# Patient Record
Sex: Male | Born: 1965 | Race: Black or African American | Hispanic: No | Marital: Married | Smoking: Never smoker
Health system: Southern US, Community
[De-identification: ages and names within clinical notes are randomized; demographics above are authoritative.]

## PROBLEM LIST (undated history)

## (undated) DIAGNOSIS — I471 Supraventricular tachycardia: Secondary | ICD-10-CM

## (undated) DIAGNOSIS — F419 Anxiety disorder, unspecified: Secondary | ICD-10-CM

## (undated) DIAGNOSIS — I4891 Unspecified atrial fibrillation: Secondary | ICD-10-CM

## (undated) HISTORY — DX: Anxiety disorder, unspecified: F41.9

## (undated) HISTORY — DX: Unspecified atrial fibrillation: I48.91

## (undated) HISTORY — DX: Supraventricular tachycardia: I47.1

---

## 2007-04-27 ENCOUNTER — Encounter: Payer: Self-pay | Admitting: Family Medicine

## 2007-05-03 ENCOUNTER — Ambulatory Visit: Payer: Self-pay | Admitting: Family Medicine

## 2007-05-03 DIAGNOSIS — R002 Palpitations: Secondary | ICD-10-CM | POA: Insufficient documentation

## 2007-05-03 DIAGNOSIS — I10 Essential (primary) hypertension: Secondary | ICD-10-CM | POA: Insufficient documentation

## 2007-05-07 ENCOUNTER — Encounter: Payer: Self-pay | Admitting: Family Medicine

## 2007-05-07 DIAGNOSIS — E785 Hyperlipidemia, unspecified: Secondary | ICD-10-CM | POA: Insufficient documentation

## 2007-05-07 LAB — CONVERTED CEMR LAB
ALT: 18 units/L (ref 0–53)
AST: 15 units/L (ref 0–37)
Alkaline Phosphatase: 70 units/L (ref 39–117)
BUN: 10 mg/dL (ref 6–23)
Creatinine, Ser: 1.07 mg/dL (ref 0.40–1.50)
Glucose, Bld: 92 mg/dL (ref 70–99)
Sodium: 138 meq/L (ref 135–145)
Total Bilirubin: 1.5 mg/dL — ABNORMAL HIGH (ref 0.3–1.2)
Total CHOL/HDL Ratio: 6
Total Protein: 7.9 g/dL (ref 6.0–8.3)
Triglycerides: 125 mg/dL (ref <150)

## 2007-05-14 ENCOUNTER — Ambulatory Visit: Payer: Self-pay | Admitting: Family Medicine

## 2007-05-14 DIAGNOSIS — F411 Generalized anxiety disorder: Secondary | ICD-10-CM | POA: Insufficient documentation

## 2007-05-15 ENCOUNTER — Encounter: Payer: Self-pay | Admitting: Family Medicine

## 2007-05-21 ENCOUNTER — Telehealth: Payer: Self-pay | Admitting: Family Medicine

## 2007-05-25 ENCOUNTER — Ambulatory Visit: Payer: Self-pay | Admitting: Family Medicine

## 2007-05-29 ENCOUNTER — Ambulatory Visit: Payer: Self-pay | Admitting: Family Medicine

## 2007-06-04 ENCOUNTER — Telehealth: Payer: Self-pay | Admitting: Family Medicine

## 2007-06-05 ENCOUNTER — Encounter: Payer: Self-pay | Admitting: Family Medicine

## 2007-06-13 ENCOUNTER — Telehealth: Payer: Self-pay | Admitting: Family Medicine

## 2007-06-20 ENCOUNTER — Telehealth: Payer: Self-pay | Admitting: Family Medicine

## 2007-06-22 ENCOUNTER — Ambulatory Visit: Payer: Self-pay | Admitting: Family Medicine

## 2007-07-09 ENCOUNTER — Ambulatory Visit: Payer: Self-pay | Admitting: Family Medicine

## 2007-07-16 ENCOUNTER — Ambulatory Visit: Payer: Self-pay | Admitting: Family Medicine

## 2007-07-16 DIAGNOSIS — R519 Headache, unspecified: Secondary | ICD-10-CM | POA: Insufficient documentation

## 2007-07-16 DIAGNOSIS — R51 Headache: Secondary | ICD-10-CM | POA: Insufficient documentation

## 2007-07-20 ENCOUNTER — Ambulatory Visit (HOSPITAL_COMMUNITY): Payer: Self-pay | Admitting: Psychiatry

## 2007-07-30 ENCOUNTER — Ambulatory Visit (HOSPITAL_COMMUNITY): Payer: Self-pay | Admitting: Psychiatry

## 2007-10-05 ENCOUNTER — Ambulatory Visit: Payer: Self-pay | Admitting: Family Medicine

## 2007-10-05 LAB — CONVERTED CEMR LAB
HDL: 52 mg/dL (ref >39)
LDL Cholesterol: 195 mg/dL — ABNORMAL HIGH (ref 0–99)
Total CHOL/HDL Ratio: 5.2

## 2007-10-08 ENCOUNTER — Encounter: Payer: Self-pay | Admitting: Family Medicine

## 2007-10-08 ENCOUNTER — Telehealth (INDEPENDENT_AMBULATORY_CARE_PROVIDER_SITE_OTHER): Payer: Self-pay | Admitting: *Deleted

## 2007-10-08 LAB — CONVERTED CEMR LAB: ALT: 21 units/L (ref 0–53)

## 2007-11-02 ENCOUNTER — Ambulatory Visit: Payer: Self-pay | Admitting: Family Medicine

## 2007-11-02 DIAGNOSIS — F411 Generalized anxiety disorder: Secondary | ICD-10-CM | POA: Insufficient documentation

## 2007-12-04 ENCOUNTER — Ambulatory Visit: Payer: Self-pay | Admitting: Family Medicine

## 2008-02-11 ENCOUNTER — Ambulatory Visit: Payer: Self-pay | Admitting: Family Medicine

## 2008-02-14 ENCOUNTER — Encounter: Payer: Self-pay | Admitting: Family Medicine

## 2008-02-15 LAB — CONVERTED CEMR LAB
ALT: 45 units/L (ref 0–53)
Albumin: 4.9 g/dL (ref 3.5–5.2)
CO2: 26 meq/L (ref 19–32)
Calcium: 9.9 mg/dL (ref 8.4–10.5)
Creatinine, Ser: 1.05 mg/dL (ref 0.40–1.50)
Glucose, Bld: 86 mg/dL (ref 70–99)
Sodium: 140 meq/L (ref 135–145)
Total CHOL/HDL Ratio: 2.8
VLDL: 12 mg/dL (ref 0–40)

## 2008-06-18 ENCOUNTER — Ambulatory Visit: Payer: Self-pay | Admitting: Family Medicine

## 2008-06-18 DIAGNOSIS — K2990 Gastroduodenitis, unspecified, without bleeding: Secondary | ICD-10-CM

## 2008-06-18 DIAGNOSIS — K219 Gastro-esophageal reflux disease without esophagitis: Secondary | ICD-10-CM | POA: Insufficient documentation

## 2008-06-18 DIAGNOSIS — K2970 Gastritis, unspecified, without bleeding: Secondary | ICD-10-CM | POA: Insufficient documentation

## 2008-09-04 ENCOUNTER — Ambulatory Visit: Payer: Self-pay | Admitting: Family Medicine

## 2008-09-10 ENCOUNTER — Telehealth: Payer: Self-pay | Admitting: Family Medicine

## 2008-09-15 ENCOUNTER — Ambulatory Visit: Payer: Self-pay | Admitting: Family Medicine

## 2008-10-29 ENCOUNTER — Ambulatory Visit: Payer: Self-pay | Admitting: Family Medicine

## 2008-10-29 DIAGNOSIS — B009 Herpesviral infection, unspecified: Secondary | ICD-10-CM | POA: Insufficient documentation

## 2008-12-22 ENCOUNTER — Ambulatory Visit: Payer: Self-pay | Admitting: Family Medicine

## 2009-02-24 ENCOUNTER — Ambulatory Visit: Payer: Self-pay | Admitting: Family Medicine

## 2009-02-24 DIAGNOSIS — L301 Dyshidrosis [pompholyx]: Secondary | ICD-10-CM | POA: Insufficient documentation

## 2009-02-25 LAB — CONVERTED CEMR LAB
ALT: 30 units/L (ref 0–53)
AST: 20 units/L (ref 0–37)
Albumin: 4.7 g/dL (ref 3.5–5.2)
Alkaline Phosphatase: 65 units/L (ref 39–117)
BUN: 13 mg/dL (ref 6–23)
Calcium: 10.3 mg/dL (ref 8.4–10.5)
Glucose, Bld: 95 mg/dL (ref 70–99)
Potassium: 4.6 meq/L (ref 3.5–5.3)
Sodium: 138 meq/L (ref 135–145)
Total CHOL/HDL Ratio: 5
Total Protein: 7.7 g/dL (ref 6.0–8.3)
Triglycerides: 90 mg/dL (ref <150)
VLDL: 18 mg/dL (ref 0–40)

## 2009-03-04 ENCOUNTER — Telehealth (INDEPENDENT_AMBULATORY_CARE_PROVIDER_SITE_OTHER): Payer: Self-pay | Admitting: *Deleted

## 2009-05-22 ENCOUNTER — Telehealth: Payer: Self-pay | Admitting: Family Medicine

## 2009-06-23 ENCOUNTER — Telehealth: Payer: Self-pay | Admitting: Family Medicine

## 2009-07-30 ENCOUNTER — Encounter: Payer: Self-pay | Admitting: Family Medicine

## 2009-09-01 ENCOUNTER — Ambulatory Visit: Payer: Self-pay | Admitting: Family Medicine

## 2009-12-28 ENCOUNTER — Telehealth: Payer: Self-pay | Admitting: Family Medicine

## 2010-02-03 ENCOUNTER — Emergency Department (HOSPITAL_COMMUNITY): Admission: EM | Admit: 2010-02-03 | Discharge: 2010-02-03 | Payer: Self-pay | Admitting: Emergency Medicine

## 2010-02-04 ENCOUNTER — Ambulatory Visit: Payer: Self-pay | Admitting: Family Medicine

## 2010-02-04 DIAGNOSIS — I471 Supraventricular tachycardia, unspecified: Secondary | ICD-10-CM

## 2010-02-04 HISTORY — DX: Supraventricular tachycardia: I47.1

## 2010-02-04 HISTORY — DX: Supraventricular tachycardia, unspecified: I47.10

## 2010-02-05 LAB — CONVERTED CEMR LAB
ALT: 43 units/L (ref 0–53)
Albumin: 4.6 g/dL (ref 3.5–5.2)
Alkaline Phosphatase: 63 units/L (ref 39–117)
Cholesterol: 154 mg/dL (ref 0–200)
LDL Cholesterol: 95 mg/dL (ref 0–99)
TSH: 0.801 microintl units/mL (ref 0.350–4.500)
Total CHOL/HDL Ratio: 3.3
Triglycerides: 67 mg/dL (ref <150)

## 2010-03-01 ENCOUNTER — Encounter: Payer: Self-pay | Admitting: Family Medicine

## 2010-06-03 ENCOUNTER — Ambulatory Visit: Payer: Self-pay | Admitting: Family Medicine

## 2010-08-31 ENCOUNTER — Ambulatory Visit: Payer: Self-pay | Admitting: Family Medicine

## 2010-09-01 LAB — CONVERTED CEMR LAB
ALT: 31 units/L (ref 0–53)
AST: 20 units/L (ref 0–37)
Albumin: 5.3 g/dL — ABNORMAL HIGH (ref 3.5–5.2)
Alkaline Phosphatase: 67 units/L (ref 39–117)
Calcium: 10 mg/dL (ref 8.4–10.5)
Creatinine, Ser: 0.98 mg/dL (ref 0.40–1.50)
HCT: 43.2 % (ref 39.0–52.0)
MCV: 93.1 fL (ref 78.0–100.0)
Potassium: 4.5 meq/L (ref 3.5–5.3)
RBC: 4.64 M/uL (ref 4.22–5.81)
Sodium: 141 meq/L (ref 135–145)
Total Bilirubin: 1 mg/dL (ref 0.3–1.2)

## 2010-09-09 ENCOUNTER — Telehealth: Payer: Self-pay | Admitting: Family Medicine

## 2010-09-17 ENCOUNTER — Encounter: Payer: Self-pay | Admitting: Family Medicine

## 2010-12-01 NOTE — Letter (Signed)
Summary: Work Excuse  [facility] [place]  [address] [place] [address], [address]   [place], [place] [address]   Phone: [phone]  Fax: [phone]    Today's Date: June 03, 2010  Name of Patient: Russell Spencer  The above named patient had a medical visit today at:  am / pm.  Please take this into consideration when reviewing the time away from work/school.    Special Instructions:  [  ] None  [  ] To be off the remainder of today, returning to the normal work / school schedule tomorrow.  [  ] To be off until the next scheduled appointment on ______________________.  [  ] Other ________________________________________________________________ ________________________________________________________________________   Sincerely yours,   Nani Gasser MD

## 2010-12-01 NOTE — Consult Note (Signed)
Summary: [facility]  [facility]   Imported By: Lanelle Bal 03/08/2010 08:32:53  _____________________________________________________________________  External Attachment:    Type:   Image     Comment:   External Document

## 2010-12-01 NOTE — Consult Note (Signed)
Summary: [facility]   Imported By: Lanelle Bal 10/06/2010 15:16:26  _____________________________________________________________________  External Attachment:    Type:   Image     Comment:   External Document

## 2010-12-01 NOTE — Assessment & Plan Note (Signed)
Summary: GERD, Upper chest discomfort.   Vital Signs:  Patient profile:   45 year old male Height:      71 inches Weight:      159 pounds Temp:     98.9 degrees F oral Pulse rate:   67 / minute BP sitting:   139 / 88  (right arm) Cuff size:   regular  Vitals Entered By: Avon Gully CMA, Duncan Dull) (August 31, 2010 8:57 AM) CC: acid reflux? feels burning sensation in the chest tried Prilosec and that didnt help, fasting blood work?   Primary Care Provider:  Nani Gasser MD  CC:  acid reflux? feels burning sensation in the chest tried Prilosec and that didnt help and fasting blood work?Marland Kitchen  History of Present Illness: acid reflux? feels burning sensation in the upper chest tried Prilosec and that didnt help, fasting blood work?  Felt the prilosec didn't help, even after taking for 2 months. Worse in the AM. No problems with dysphagia. Occ will belch and it will feel so much better.  Not worse with eating food.   No brash.  No abdominal pain. Discomfort in the upper chest almost every days, mostly in the morning. Doesn't usually eat breakfast.  Then feels it disturbs his breathing and then feels anxious but not every time.      Current Medications (verified): 1)  Lisinopril-Hydrochlorothiazide 10-12.5 Mg  Tabs (Lisinopril-Hydrochlorothiazide) .... Take 1 Tablet By Mouth Once A Day 2)  Lipitor 40 Mg  Tabs (Atorvastatin Calcium) .Marland Kitchen.. 1 Tab By Mouth Qhs 3)  Alprazolam 0.25 Mg Tabs (Alprazolam) .... Take 1 Tablet By Mouth Three Times A Day As Needed For Anxiety  Allergies (verified): 1)  ! Pcn  Comments:  Nurse/Medical Assistant: The patient's medications and allergies were reviewed with the patient and were updated in the Medication and Allergy Lists. Avon Gully CMA, Duncan Dull) (August 31, 2010 8:59 AM)  Physical Exam  General:  Well-developed,well-nourished,in no acute distress; alert,appropriate and cooperative throughout examination Mouth:  Oral mucosa and  oropharynx without lesions or exudates.  Teeth in good repair. Lungs:  Normal respiratory effort, chest expands symmetrically. Lungs are clear to auscultation, no crackles or wheezes. Heart:  Normal rate and regular rhythm. S1 and S2 normal without gallop, murmur, click, rub or other extra sounds. Abdomen:  Bowel sounds positive,abdomen soft and non-tender without masses, organomegaly or hernias noted.   Impression & Recommendations:  Problem # 1:  GERD (ICD-530.81) Will change to dexilant since has failed prilosec. If not better in one week then will refer to GI for further evaluation.  Also consider this could be related ot his anxiety but want to rule out any GI problems first. This does not sounds cardiac at all. Will get labs to rule out anemia, thyroid problems or electrolyte disturbance.    Complete Medication List: 1)  Lisinopril-hydrochlorothiazide 10-12.5 Mg Tabs (Lisinopril-hydrochlorothiazide) .... Take 1 tablet by mouth once a day 2)  Lipitor 40 Mg Tabs (Atorvastatin calcium) .Marland Kitchen.. 1 tab by mouth qhs 3)  Alprazolam 0.25 Mg Tabs (Alprazolam) .... Take 1 tablet by mouth three times a day as needed for anxiety  Other Orders: T-CBC No Diff ([id number]) T-Comprehensive Metabolic Panel ([id number]) T-TSH ([id number])  Patient Instructions: 1)  If not better in one week on the samples then call our office and willrefer to Gastroenterology.    Orders Added: 1)  T-CBC No Diff [[id number]] 2)  T-Comprehensive Metabolic Panel [[id number]] 3)  T-TSH [[id number]] 4)  Est. Patient Level IV [  [address]] 

## 2010-12-01 NOTE — Progress Notes (Signed)
Summary: Med for getting anxious  Phone Note Call from Patient Call back at Home Phone [phone]   Summary of Call: Has to give a presentation at work and gets very anxious and you told him there was something you could give him that would help. [facility]ses [facility]. Initial call taken by: Kathlene November,  December 28, 2009 11:41 AM  Follow-up for Phone Call        Can start sertraline and then f/u in 2-3 weeks.  Follow-up by: Nani Gasser MD,  December 28, 2009 12:06 PM  Additional Follow-up for Phone Call Additional follow up Details #1::        Pt notified Additional Follow-up by: Kathlene November,  December 28, 2009 12:27 PM    New/Updated Medications: SERTRALINE HCL 50 MG TABS (SERTRALINE HCL) one by mouth daily Prescriptions: SERTRALINE HCL 50 MG TABS (SERTRALINE HCL) one by mouth daily  #30 x 0   Entered and Authorized by:   Nani Gasser MD   Signed by:   Nani Gasser MD on 12/28/2009   Method used:   [facility] to        [phone][facility] (retail)       [address] [place], [place]  [address]       Ph: [phone] or [phone]       Fax: [phone]   RxID:   [id number]

## 2010-12-01 NOTE — Progress Notes (Signed)
Summary: referral  Phone Note Call from Patient Call back at Home Phone [phone] Call back at [phone]   Caller: Patient Call For: Nani Gasser MD Summary of Call: Pt would like the GI referral that you had discussed with him at last office visit Initial call taken by: Kathlene November LPN,  September 09, 2010 2:43 PM  Follow-up for Phone Call        + done Follow-up by: Nani Gasser MD,  September 09, 2010 3:11 PM

## 2010-12-01 NOTE — Assessment & Plan Note (Signed)
Summary: super ventricular Tachacardia  (SVT)    Vital Signs:  Patient profile:   45 year old male Height:      71 inches Weight:      159 pounds BMI:     22.26 Pulse rate:   77 / minute BP sitting:   130 / 76  (left arm) Cuff size:   large  Vitals Entered By: Kathlene November (February 04, 2010 11:03 AM)  CC: followup from ED yesterday- SVT heart rate was 236- told to followup and discuss a beta blocker   Primary Care Provider:  Nani Gasser MD  CC:  followup from ED yesterday- SVT heart rate was 236- told to followup and discuss a beta blocker.  History of Present Illness: followup from ED yesterday- SVT heart rate was 236- told to followup and discuss a beta blocker.  Pt had a similar episode in 2008 and was seen by Dr. Lendon Colonel at [facility] and had a normal stress test at that time per our records. Also normal echo.  This episode started about 1 hour after eating a fish sandwhich. No caffein. Nothing unusual. Started while pumping gas. Last episode was in 2008.  Today he feels fine. He was given IV Adenosine and the tachycardia resolved. He has not history of drug use.       Current Medications (verified): 1)  Lisinopril-Hydrochlorothiazide 10-12.5 Mg  Tabs (Lisinopril-Hydrochlorothiazide) .... Take 1 Tablet By Mouth Once A Day 2)  Lipitor 40 Mg  Tabs (Atorvastatin Calcium) .Marland Kitchen.. 1 Tab By Mouth Qhs 3)  Triamcinolone in Absorbase 0.05 % Oint (Triamcinolone Acetonide) .... Apply Once Daily To Affected Area.  Allergies (verified): 1)  ! Pcn  Comments:  Nurse/Medical Assistant: The patient's medications and allergies were reviewed with the patient and were updated in the Medication and Allergy Lists. Kathlene November (February 04, 2010 11:04 AM)  Social History: Reviewed history from 12/22/2008 and no changes required. Working for [employer] as a Production designer, theatre/television/film. Married to [employer] with 3 children.   Never Smoked Alcohol use-yes Drug use-no Regular exercise-no  Physical Exam  General:   Well-developed,well-nourished,in no acute distress; alert,appropriate and cooperative throughout examination Head:  Normocephalic and atraumatic without obvious abnormalities. No apparent alopecia or balding. Lungs:  Normal respiratory effort, chest expands symmetrically. Lungs are clear to auscultation, no crackles or wheezes. Heart:  Normal rate and regular rhythm. S1 and S2 normal without gallop, murmur, click, rub or other extra sounds. Skin:  no rashes.   Cervical Nodes:  No lymphadenopathy noted Psych:  Cognition and judgment appear intact. Alert and cooperative with normal attention span and concentration. No apparent delusions, illusions, hallucinations   Impression & Recommendations:  Problem # 1:  SUPRAVENTRICULAR TACHYCARDIA (ICD-427.89) Discussed options including starting a betablocker for HR control. Being that his last episode was 3 years ago and last w/u with 3 years ago did recommend he see cardiology for a further workup and managemtn. For now avoid any caffeine and if sxs recur go to the ED immediatly. BP looks great today and he does take his BP med regularly.  Orders: Cardiology Referral (Cardiology)  Complete Medication List: 1)  Lisinopril-hydrochlorothiazide 10-12.5 Mg Tabs (Lisinopril-hydrochlorothiazide) .... Take 1 tablet by mouth once a day 2)  Lipitor 40 Mg Tabs (Atorvastatin calcium) .Marland Kitchen.. 1 tab by mouth qhs 3)  Triamcinolone in Absorbase 0.05 % Oint (Triamcinolone acetonide) .... Apply once daily to affected area.  Other Orders: T-Comprehensive Metabolic Panel [id number]) T-Lipid Profile [phone]) T-TSH [phone])  Patient Instructions: 1)  We will call you with your lab results. 2)  We will schedule you with Dr. Lendon Colonel.

## 2010-12-01 NOTE — Assessment & Plan Note (Signed)
Summary: Feels Short of Breath sometimes x2days   Vital Signs:  Patient profile:   45 year old male Height:      71 inches Weight:      157 pounds O2 Sat:      96 % Pulse rate:   69 / minute BP sitting:   126 / 83  (left arm) Cuff size:   regular  Vitals Entered By: Avon Gully CMA, (AAMA) (June 03, 2010 11:13 AM) CC: SOB since yesterday.    Primary Care Provider:  Nani Gasser MD  CC:  SOB since yesterday. Marland Kitchen  History of Present Illness: Yesterday when going to work felt overwhelmed adn felt might have a panic attack. Started having some indigestion as well.  Better today.  Started the prilosec.  A little SOB today.  Hasn't used the alprazolam inover a year.  Current rx is out of date.  No cough or cold symptoms or fever.  NO CP today.    Current Medications (verified): 1)  Lisinopril-Hydrochlorothiazide 10-12.5 Mg  Tabs (Lisinopril-Hydrochlorothiazide) .... Take 1 Tablet By Mouth Once A Day 2)  Lipitor 40 Mg  Tabs (Atorvastatin Calcium) .Marland Kitchen.. 1 Tab By Mouth Qhs  Allergies (verified): 1)  ! Pcn  Comments:  Nurse/Medical Assistant: The patient's medications and allergies were reviewed with the patient and were updated in the Medication and Allergy Lists. Avon Gully CMA, Duncan Dull) (June 03, 2010 11:15 AM)  Past History:  Past Medical History: Last updated: 09/15/2008 generalized anxiety d/o  Social History: Last updated: 12/22/2008 Working for [employer] as a Production designer, theatre/television/film. Married to [employer] with 3 children.   Never Smoked Alcohol use-yes Drug use-no Regular exercise-no  Physical Exam  General:  Well-developed,well-nourished,in no acute distress; alert,appropriate and cooperative throughout examination Head:  Normocephalic and atraumatic without obvious abnormalities. No apparent alopecia or balding. Eyes:  No corneal or conjunctival inflammation noted. EOMI. Perrla.  Neck:  No deformities, masses, or tenderness noted. Lungs:  Normal respiratory  effort, chest expands symmetrically. Lungs are clear to auscultation, no crackles or wheezes. Heart:  Normal rate and regular rhythm. S1 and S2 normal without gallop, murmur, click, rub or other extra sounds. Abdomen:  Bowel sounds positive,abdomen soft and non-tender without masses, organomegaly or hernias noted. Skin:  no rashes.   Psych:  Cognition and judgment appear intact. Alert and cooperative with normal attention span and concentration. No apparent delusions, illusions, hallucinations   Impression & Recommendations:  Problem # 1:  SHORTNESS OF BREATH (ICD-786.05) Lkely causd by GED and anxiety.  Continue the prilosec. If not helping his sxs or his reflux then please let me now. Also discussed decreasing his stress. Will refill his xnanx just in case.    Problem # 2:  ANXIETY STATE, UNSPECIFIED (ICD-300.00)  Also discussed decreasing his stress. Will refill his xnanx just in case.  Call if starts having panic attacks again.  His updated medication list for this problem includes:    Alprazolam 0.25 Mg Tabs (Alprazolam) .Marland Kitchen... Take 1 tablet by mouth three times a day as needed for anxiety  Complete Medication List: 1)  Lisinopril-hydrochlorothiazide 10-12.5 Mg Tabs (Lisinopril-hydrochlorothiazide) .... Take 1 tablet by mouth once a day 2)  Lipitor 40 Mg Tabs (Atorvastatin calcium) .Marland Kitchen.. 1 tab by mouth qhs 3)  Alprazolam 0.25 Mg Tabs (Alprazolam) .... Take 1 tablet by mouth three times a day as needed for anxiety  Patient Instructions: 1)  Call if SOB gets worse, recurs, or start to get a cough or  fever.  Prescriptions: ALPRAZOLAM 0.25 MG TABS (ALPRAZOLAM) Take 1 tablet by mouth three times a day as needed for anxiety  #15 x 0   Entered and Authorized by:   Nani Gasser MD   Signed by:   Nani Gasser MD on 06/03/2010   Method used:   Print then Give to Patient   RxID:   [id number]

## 2010-12-21 ENCOUNTER — Encounter: Payer: Self-pay | Admitting: Family Medicine

## 2011-01-06 NOTE — Consult Note (Signed)
Summary: [facility]   Imported By: Lanelle Bal 12/31/2010 09:32:29  _____________________________________________________________________  External Attachment:    Type:   Image     Comment:   External Document

## 2011-01-19 LAB — DIFFERENTIAL
Basophils Relative: 0 % (ref 0–1)
Eosinophils Relative: 1 % (ref 0–5)
Lymphocytes Relative: 49 % — ABNORMAL HIGH (ref 12–46)
Monocytes Relative: 13 % — ABNORMAL HIGH (ref 3–12)
Neutro Abs: 2.6 10*3/uL (ref 1.7–7.7)
Neutrophils Relative %: 38 % — ABNORMAL LOW (ref 43–77)

## 2011-01-19 LAB — CBC
MCV: 93.4 fL (ref 78.0–100.0)
RBC: 4.88 MIL/uL (ref 4.22–5.81)
RDW: 12.7 % (ref 11.5–15.5)
WBC: 6.9 10*3/uL (ref 4.0–10.5)

## 2011-01-19 LAB — POCT CARDIAC MARKERS
Myoglobin, poc: 47.7 ng/mL (ref 12–200)
Troponin i, poc: <0.05 ng/mL (ref 0.00–0.09)

## 2011-01-19 LAB — POCT I-STAT, CHEM 8
Creatinine, Ser: 1.1 mg/dL (ref 0.4–1.5)
Hemoglobin: 17 g/dL (ref 13.0–17.0)

## 2011-03-31 ENCOUNTER — Ambulatory Visit (INDEPENDENT_AMBULATORY_CARE_PROVIDER_SITE_OTHER): Payer: Commercial Managed Care - PPO | Admitting: Family Medicine

## 2011-03-31 ENCOUNTER — Encounter: Payer: Self-pay | Admitting: Family Medicine

## 2011-03-31 DIAGNOSIS — Z0000 Encounter for general adult medical examination without abnormal findings: Secondary | ICD-10-CM

## 2011-03-31 DIAGNOSIS — R7309 Other abnormal glucose: Secondary | ICD-10-CM

## 2011-03-31 NOTE — Progress Notes (Signed)
  Subjective:    Patient ID: Russell Spencer, male    DOB: 1966/06/03, 45 y.o.   MRN: [id number]  HPI  Here for CPE. No complaints. Never had her abnormal sugar checked.    Review of Systems  Constitutional: Negative for fever, activity change, fatigue and unexpected weight change.  Respiratory: Negative for cough and shortness of breath.   Cardiovascular: Negative for chest pain and palpitations.  Psychiatric/Behavioral: Negative for agitation. The patient is not nervous/anxious.     BP 125/83  Pulse 66  Resp 20  Ht 5\' 10"  (1.778 m)  Wt 150 lb (68.04 kg)  BMI 21.52 kg/m2  SpO2 98%    Allergies  Allergen Reactions  . Penicillins     REACTION: rash    No past medical history on file.  No past surgical history on file.  History   Social History  . Marital Status: Married    Spouse Name: N/A    Number of Children: 3  . Years of Education: N/A   Occupational History  . Not on file.   Social History Main Topics  . Smoking status: Never Smoker   . Smokeless tobacco: Not on file  . Alcohol Use: No  . Drug Use: No  . Sexually Active: Yes -- Male partner(s)   Other Topics Concern  . Not on file   Social History Narrative  . No narrative on file    Family History  Problem Relation Age of Onset  . Diabetes Mother   . Hypertension Mother   . Hyperlipidemia Mother   . Kidney failure Mother     Current outpatient prescriptions:ALPRAZolam (XANAX) 0.25 MG tablet, Take 0.25 mg by mouth at bedtime as needed.  , Disp: , Rfl: ;  atorvastatin (LIPITOR) 20 MG tablet, Take 20 mg by mouth daily.  , Disp: , Rfl: ;  lisinopril (PRINIVIL,ZESTRIL) 2.5 MG tablet, Take 2.5 mg by mouth daily.  , Disp: , Rfl:      Objective:   Physical Exam  Constitutional: He is oriented to person, place, and time. He appears well-developed and well-nourished.  HENT:  Head: Normocephalic and atraumatic.  Right Ear: External ear normal.  Left Ear: External ear normal.  Nose: Nose normal.    Mouth/Throat: Oropharynx is clear and moist.  Eyes: Conjunctivae and EOM are normal. Pupils are equal, round, and reactive to light.  Neck: Normal range of motion. Neck supple. No thyromegaly present.  Cardiovascular: Normal rate, regular rhythm, normal heart sounds and intact distal pulses.   Pulmonary/Chest: Effort normal and breath sounds normal.  Abdominal: Soft. Bowel sounds are normal. He exhibits no distension and no mass. There is no tenderness. There is no rebound and no guarding.  Musculoskeletal: Normal range of motion.  Lymphadenopathy:    He has no cervical adenopathy.  Neurological: He is alert and oriented to person, place, and time. He has normal reflexes.  Skin: Skin is warm and dry.  Psychiatric: He has a normal mood and affect. His behavior is normal. Judgment and thought content normal.  TMs and canals are clear bilaterally.         Assessment & Plan:  CPE - Doing well. No getting any regular exercise right now.  Due for screening labs.  Dong well with his anxiety. 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  We discussed the importance of streating his HTN and cholesterol and monitor for diabetes.  HE has a strong family hx in his mother.

## 2011-04-01 ENCOUNTER — Telehealth: Payer: Self-pay | Admitting: Family Medicine

## 2011-04-01 LAB — COMPLETE METABOLIC PANEL WITH GFR
AST: 17 U/L (ref 0–37)
Alkaline Phosphatase: 62 U/L (ref 39–117)
BUN: 12 mg/dL (ref 6–23)
CO2: 34 mEq/L — ABNORMAL HIGH (ref 19–32)
Calcium: 10.6 mg/dL — ABNORMAL HIGH (ref 8.4–10.5)
Creat: 1.06 mg/dL (ref 0.40–1.50)
GFR, Est African American: >60 mL/min (ref >60)
Glucose, Bld: 89 mg/dL (ref 70–99)
Sodium: 135 mEq/L (ref 135–145)
Total Bilirubin: 1.4 mg/dL — ABNORMAL HIGH (ref 0.3–1.2)

## 2011-04-01 LAB — LIPID PANEL
LDL Cholesterol: 100 mg/dL — ABNORMAL HIGH (ref 0–99)
Total CHOL/HDL Ratio: 3.2 Ratio
Triglycerides: 68 mg/dL (ref <150)
VLDL: 14 mg/dL (ref 0–40)

## 2011-04-01 LAB — HEMOGLOBIN A1C
Hgb A1c MFr Bld: 5.1 % (ref <5.7)
Mean Plasma Glucose: 100 mg/dL (ref <117)

## 2011-04-01 NOTE — Telephone Encounter (Signed)
Pt's wife notified.

## 2011-04-01 NOTE — Telephone Encounter (Signed)
Call pt: Calcium borderline elevated. Recheck in one month.  Cholesterol and sugar adn A1C look great!!

## 2011-04-09 ENCOUNTER — Other Ambulatory Visit: Payer: Self-pay | Admitting: Family Medicine

## 2011-04-16 ENCOUNTER — Other Ambulatory Visit: Payer: Self-pay | Admitting: Family Medicine

## 2011-08-19 ENCOUNTER — Other Ambulatory Visit: Payer: Self-pay | Admitting: Family Medicine

## 2011-10-14 ENCOUNTER — Encounter: Payer: Commercial Managed Care - PPO | Admitting: Family Medicine

## 2011-11-09 ENCOUNTER — Ambulatory Visit (INDEPENDENT_AMBULATORY_CARE_PROVIDER_SITE_OTHER): Payer: Self-pay | Admitting: Family Medicine

## 2011-11-09 ENCOUNTER — Encounter: Payer: Self-pay | Admitting: Family Medicine

## 2011-11-09 VITALS — BP 127/86 | HR 80 | Temp 97.5°F | Ht 70.0 in | Wt 153.0 lb

## 2011-11-09 DIAGNOSIS — Z0000 Encounter for general adult medical examination without abnormal findings: Secondary | ICD-10-CM

## 2011-11-09 LAB — LIPID PANEL
LDL Cholesterol: 86 mg/dL (ref 0–99)
Total CHOL/HDL Ratio: 2.9 Ratio
Triglycerides: 68 mg/dL (ref <150)
VLDL: 14 mg/dL (ref 0–40)

## 2011-11-09 MED ORDER — ALPRAZOLAM 0.25 MG PO TABS: 0.2500 mg | ORAL_TABLET | Freq: Every evening | ORAL | Status: DC | PRN

## 2011-11-09 NOTE — Progress Notes (Signed)
  Subjective:    Patient ID: Russell Spencer, male    DOB: 07-19-1966, 46 y.o.   MRN: [id number]  HPI  Here for CPE. No complaints.    Review of Systems Comprehensive ROS is neg  BP 127/86  Pulse 80  Temp(Src) 97.5 F (36.4 C) (Oral)  Ht 5\' 10"  (1.778 m)  Wt 153 lb (69.4 kg)  BMI 21.95 kg/m2  SpO2 98%    Allergies  Allergen Reactions  . Penicillins     REACTION: rash    Past Medical History  Diagnosis Date  . Anxiety     History reviewed. No pertinent past surgical history.  History   Social History  . Marital Status: Married    Spouse Name: N/A    Number of Children: 3  . Years of Education: N/A   Occupational History  .      [employer]    Social History Main Topics  . Smoking status: Never Smoker   . Smokeless tobacco: Not on file  . Alcohol Use: No  . Drug Use: No  . Sexually Active: Yes -- Male partner(s)   Other Topics Concern  . Not on file   Social History Narrative   Some exercise, 2 x a week.      Family History  Problem Relation Age of Onset  . Diabetes Mother   . Hypertension Mother   . Hyperlipidemia Mother   . Kidney failure Mother     Dialysis        Objective:   Physical Exam  Constitutional: He is oriented to person, place, and time. He appears well-developed and well-nourished.  HENT:  Head: Normocephalic and atraumatic.  Right Ear: External ear normal.  Left Ear: External ear normal.  Nose: Nose normal.  Mouth/Throat: Oropharynx is clear and moist.  Eyes: Conjunctivae and EOM are normal. Pupils are equal, round, and reactive to light.  Neck: Normal range of motion. Neck supple. No thyromegaly present.  Cardiovascular: Normal rate, regular rhythm, normal heart sounds and intact distal pulses.   Pulmonary/Chest: Effort normal and breath sounds normal.  Abdominal: Soft. Bowel sounds are normal. He exhibits no distension and no mass. There is no tenderness. There is no rebound and no guarding.  Musculoskeletal: Normal range  of motion.  Lymphadenopathy:    He has no cervical adenopathy.  Neurological: He is alert and oriented to person, place, and time. He has normal reflexes.  Skin: Skin is warm and dry.  Psychiatric: He has a normal mood and affect. His behavior is normal. Judgment and thought content normal.          Assessment & Plan:  Start a regular exercise program and make sure you are eating a healthy diet Try to eat 4 servings of dairy a day or take a calcium supplement (500mg  twice a day). Declined tdap and flu vaccine.

## 2011-11-09 NOTE — Patient Instructions (Signed)
Start a regular exercise program and make sure you are eating a healthy diet Try to eat 4 servings of dairy a day or take a calcium supplement (500mg  twice a day). Think about the tetanus shot.

## 2011-11-10 LAB — COMPLETE METABOLIC PANEL WITH GFR
Albumin: 4.9 g/dL (ref 3.5–5.2)
Alkaline Phosphatase: 63 U/L (ref 39–117)
CO2: 28 mEq/L (ref 19–32)
Chloride: 98 mEq/L (ref 96–112)
Total Protein: 7.5 g/dL (ref 6.0–8.3)

## 2012-01-19 ENCOUNTER — Other Ambulatory Visit: Payer: Self-pay | Admitting: Family Medicine

## 2012-03-29 ENCOUNTER — Ambulatory Visit: Payer: Commercial Managed Care - PPO | Admitting: Family Medicine

## 2012-06-04 ENCOUNTER — Other Ambulatory Visit: Payer: Self-pay | Admitting: Family Medicine

## 2012-06-07 ENCOUNTER — Encounter: Payer: Self-pay | Admitting: Family Medicine

## 2012-06-07 ENCOUNTER — Ambulatory Visit (INDEPENDENT_AMBULATORY_CARE_PROVIDER_SITE_OTHER): Payer: BC Managed Care – PPO | Admitting: Family Medicine

## 2012-06-07 VITALS — BP 134/86 | HR 98 | Ht 70.0 in | Wt 158.0 lb

## 2012-06-07 DIAGNOSIS — M5412 Radiculopathy, cervical region: Secondary | ICD-10-CM

## 2012-06-07 DIAGNOSIS — R209 Unspecified disturbances of skin sensation: Secondary | ICD-10-CM

## 2012-06-07 DIAGNOSIS — R200 Anesthesia of skin: Secondary | ICD-10-CM

## 2012-06-07 MED ORDER — PREDNISONE 20 MG PO TABS: 40.0000 mg | ORAL_TABLET | Freq: Every day | ORAL | Status: AC

## 2012-06-07 NOTE — Progress Notes (Signed)
  Subjective:    Patient ID: Russell Spencer, male    DOB: May 08, 1966, 46 y.o.   MRN: [id number]  HPI If reaches out will get a tingling/pain in his right index finger.  No locking or popping of the finger. Started 2 week ago.  No shoulder or elbow pain.  Last a few second and then will ease off.  Will feel it along the lateral side of the finger. Doesn't bother him at night. No finger joint pain.  He has been working out and [employer].  He denies any pain in his neck, elbow, shoulder, wrist.   Review of Systems     Objective:   Physical Exam  Constitutional: He appears well-developed and well-nourished.  Musculoskeletal:       Neck with normal range of motion. Right shoulder right elbow and right wrist with normal range of motion. Strength in the shoulder, elbow, wrist is 5 over 5. Strength in all fingers is 5 out of 5. Normal flexion extension of all fingers. No swelling of the joints or tenderness over the actual index finger. No evidence of triggering palpable lesions along the palm.          Assessment & Plan:  Possible C6 radiculopathy  - Discussed tx with prednisone and/or NSAID and cervical exercises.  Given 5 days of prednisone. After that he can just take one Aleve daily. Make sure take them with food and water and stop immediately if any GI irritation or upset. Given a handout for cervical exercises for his neck.   If not better in 2-3 weeks then follow up with  Dr. Benjamin Stain. I did encourage him to stop his overhead weight lifting for at least the next 2 weeks.

## 2012-06-07 NOTE — Patient Instructions (Addendum)
Start exercises. Complete the 5 days of prednisone If you are not better in 2-3 weeks then schedule a follow up with my partner with Dr. Benjamin Stain.

## 2012-07-19 ENCOUNTER — Ambulatory Visit (INDEPENDENT_AMBULATORY_CARE_PROVIDER_SITE_OTHER): Payer: BC Managed Care – PPO | Admitting: Family Medicine

## 2012-07-19 ENCOUNTER — Encounter: Payer: Self-pay | Admitting: Family Medicine

## 2012-07-19 VITALS — BP 132/100 | HR 97 | Wt 158.0 lb

## 2012-07-19 DIAGNOSIS — IMO0001 Reserved for inherently not codable concepts without codable children: Secondary | ICD-10-CM

## 2012-07-19 DIAGNOSIS — J029 Acute pharyngitis, unspecified: Secondary | ICD-10-CM

## 2012-07-19 DIAGNOSIS — R030 Elevated blood-pressure reading, without diagnosis of hypertension: Secondary | ICD-10-CM

## 2012-07-19 NOTE — Progress Notes (Signed)
  Subjective:    Patient ID: Russell Spencer, male    DOB: 1965/12/24, 46 y.o.   MRN: [id number]  HPI Mild St that is raspy voice for a few days. No Post nasal drip. Not painful to eat or swallow. No fever.   No ear pain or pressure. Positive strep exposure at work. No about the home is sick.   Review of Systems     Objective:   Physical Exam  Constitutional: He is oriented to person, place, and time. He appears well-developed and well-nourished.  HENT:  Head: Normocephalic and atraumatic.  Right Ear: External ear normal.  Left Ear: External ear normal.  Nose: Nose normal.  Mouth/Throat: Oropharynx is clear and moist.       TMs and canals are clear.   Eyes: Conjunctivae normal and EOM are normal. Pupils are equal, round, and reactive to light.  Neck: Neck supple. No thyromegaly present.  Cardiovascular: Normal rate and normal heart sounds.   Pulmonary/Chest: Effort normal and breath sounds normal.  Lymphadenopathy:    He has no cervical adenopathy.  Neurological: He is alert and oriented to person, place, and time.  Skin: Skin is warm and dry.  Psychiatric: He has a normal mood and affect.          Assessment & Plan:  Pharyngitis, mild - likley viral vs allergies. Strep is neg. Recommend symptomatic care. Call if not better in one week.  Elevated blood pressure-I. recommend he followup in one to 2 weeks to recheck his blood pressure with a nurse visit.  Declined flu vaccine

## 2012-08-02 ENCOUNTER — Ambulatory Visit: Payer: BC Managed Care – PPO | Admitting: Family Medicine

## 2012-08-13 ENCOUNTER — Ambulatory Visit (INDEPENDENT_AMBULATORY_CARE_PROVIDER_SITE_OTHER): Payer: BC Managed Care – PPO | Admitting: Family Medicine

## 2012-08-13 ENCOUNTER — Telehealth: Payer: Self-pay

## 2012-08-13 VITALS — BP 125/78 | HR 78 | Temp 97.8°F

## 2012-08-13 DIAGNOSIS — IMO0001 Reserved for inherently not codable concepts without codable children: Secondary | ICD-10-CM

## 2012-08-13 DIAGNOSIS — R030 Elevated blood-pressure reading, without diagnosis of hypertension: Secondary | ICD-10-CM

## 2012-08-13 NOTE — Telephone Encounter (Signed)
Left message on machine for patient to return my call if any concerns or question. Otherwise blood pressure is good per Dr Linford Arnold. See blood pressure check encounter.

## 2012-08-13 NOTE — Progress Notes (Signed)
  Subjective:    Patient ID: Russell Spencer, male    DOB: September 28, 1966, 46 y.o.   MRN: [id number]  Russell Spencer is here today for a blood pressure check. He denies headaches, sob, chest pain or vision problems.  HPI    Review of Systems     Objective:   Physical Exam        Assessment & Plan:  Elevated blood pressure-repeat blood pressure today looks absolutely fantastic. We will continue to monitor. No further followup needed for this at this time. Nani Gasser, MD

## 2012-08-23 ENCOUNTER — Telehealth: Payer: Self-pay | Admitting: *Deleted

## 2012-08-23 MED ORDER — HYDROCODONE-HOMATROPINE 5-1.5 MG/5ML PO SYRP: 5.0000 mL | ORAL_SOLUTION | Freq: Every evening | ORAL | Status: DC | PRN

## 2012-08-23 NOTE — Telephone Encounter (Signed)
Pt has had a cold and feeling better but still has a cough at night and first thing in the am. Wants a rx for cough syrup. Uses [facility]

## 2012-08-23 NOTE — Telephone Encounter (Signed)
rx printed. Ok to fax.  

## 2012-08-23 NOTE — Telephone Encounter (Signed)
Pt.notified

## 2012-11-09 ENCOUNTER — Other Ambulatory Visit: Payer: Self-pay | Admitting: Family Medicine

## 2012-11-11 ENCOUNTER — Other Ambulatory Visit: Payer: Self-pay | Admitting: Family Medicine

## 2012-12-21 ENCOUNTER — Other Ambulatory Visit: Payer: Self-pay | Admitting: *Deleted

## 2012-12-21 MED ORDER — LISINOPRIL-HYDROCHLOROTHIAZIDE 10-12.5 MG PO TABS: ORAL_TABLET | ORAL | Status: DC

## 2012-12-21 MED ORDER — ATORVASTATIN CALCIUM 40 MG PO TABS: ORAL_TABLET | ORAL | Status: DC

## 2013-01-25 ENCOUNTER — Encounter: Payer: Self-pay | Admitting: Family Medicine

## 2013-01-25 ENCOUNTER — Ambulatory Visit (INDEPENDENT_AMBULATORY_CARE_PROVIDER_SITE_OTHER): Payer: BC Managed Care – PPO | Admitting: Family Medicine

## 2013-01-25 VITALS — BP 125/83 | HR 71 | Temp 98.3°F | Wt 156.0 lb

## 2013-01-25 DIAGNOSIS — H01009 Unspecified blepharitis unspecified eye, unspecified eyelid: Secondary | ICD-10-CM

## 2013-01-25 DIAGNOSIS — H5789 Other specified disorders of eye and adnexa: Secondary | ICD-10-CM

## 2013-01-25 DIAGNOSIS — H01003 Unspecified blepharitis right eye, unspecified eyelid: Secondary | ICD-10-CM

## 2013-01-25 MED ORDER — OLOPATADINE HCL 0.2 % OP SOLN: OPHTHALMIC | Status: DC

## 2013-01-25 NOTE — Progress Notes (Signed)
CC: Russell Spencer is a 47 y.o. male is here for right eye irritated   Subjective: HPI:  Patient complains of a bilateral eye redness.  When it occurs it is always unilateral, more often on the right. It comes on suddenly first thing in the morning, describes she wakes up with it. Slowly improves over 3 days to return back to normal. During redness episodes he complains of bitterly/mild eye ear dictation it is hard to describe but he denies pain or foreign body sensation. He does have some crusting most mornings in the right eye lid more than left. Interventions have included Visine which did not help much. He does not wear contacts and denies manipulating the eye. Nothing makes symptoms better or worse. During, before, and after these episodes he denies photophobia, discharge, vision loss. He denies recent or remote fevers, chills, rashes, nasal congestion, itchy eyes, sore throat, facial pressure.    Review Of Systems Outlined In HPI  Past Medical History  Diagnosis Date  . Anxiety      Family History  Problem Relation Age of Onset  . Diabetes Mother   . Hypertension Mother   . Hyperlipidemia Mother   . Kidney failure Mother     Dialysis     History  Substance Use Topics  . Smoking status: Never Smoker   . Smokeless tobacco: Not on file  . Alcohol Use: No     Objective: Filed Vitals:   01/25/13 1116  BP: 125/83  Pulse: 71  Temp: 98.3 F (36.8 C)    Vital signs reviewed. General: Alert and Oriented, No Acute Distress HEENT: Pupils equal, round, reactive to light. Conjunctivae clear.  Anterior chamber without debris bilaterally, pigmented lesion approximately 7:00/8:00 right eye. Mild crusting on proximal eyelashes on right eye.  No pain with extraocular movement testing were within light. External ears unremarkable.  Moist mucous membranes. Lungs: Clear and comfortable work of breathing, speaking in full sentences without accessory muscle use. Cardiac: Regular rate and  rhythm.  Neuro: CN II-XII grossly intact, gait normal. Extremities: No peripheral edema.  Strong peripheral pulses.  Mental Status: No depression, anxiety, nor agitation. Logical though process. Skin: Warm and dry.  Assessment & Plan: Russell Spencer was seen today for right eye irritated.  Diagnoses and associated orders for this visit:  Red eye - Olopatadine HCl (PATADAY) 0.2 % SOLN; Apply one in red eye daily.  Blepharitis of right eye    Discussed with patient using baby shampoo on a washcloth to massage all 4 eyelids with eyes closed twice a day for 2 weeks. Concern for blepharitis. May also try samples of Pataday to help determine if there is an allergic component as well however lower differential. Call if symptoms not improving with next episode, then we'll refer to optho.   Return in about 4 weeks (around 02/22/2013).

## 2013-05-08 ENCOUNTER — Encounter: Payer: BC Managed Care – PPO | Admitting: Family Medicine

## 2013-05-08 DIAGNOSIS — Z0289 Encounter for other administrative examinations: Secondary | ICD-10-CM

## 2013-05-30 ENCOUNTER — Ambulatory Visit (INDEPENDENT_AMBULATORY_CARE_PROVIDER_SITE_OTHER): Payer: BC Managed Care – PPO | Admitting: Family Medicine

## 2013-05-30 ENCOUNTER — Encounter: Payer: Self-pay | Admitting: Family Medicine

## 2013-05-30 VITALS — BP 123/84 | HR 78 | Ht 70.0 in | Wt 158.0 lb

## 2013-05-30 DIAGNOSIS — M7520 Bicipital tendinitis, unspecified shoulder: Secondary | ICD-10-CM

## 2013-05-30 DIAGNOSIS — M7521 Bicipital tendinitis, right shoulder: Secondary | ICD-10-CM

## 2013-05-30 DIAGNOSIS — Z0000 Encounter for general adult medical examination without abnormal findings: Secondary | ICD-10-CM

## 2013-05-30 LAB — COMPLETE METABOLIC PANEL WITH GFR
ALT: 25 U/L (ref 0–53)
AST: 18 U/L (ref 0–37)
Albumin: 5.1 g/dL (ref 3.5–5.2)
Alkaline Phosphatase: 68 U/L (ref 39–117)
Calcium: 11.2 mg/dL — ABNORMAL HIGH (ref 8.4–10.5)
Chloride: 98 mEq/L (ref 96–112)
GFR, Est African American: >89 mL/min
GFR, Est Non African American: 80 mL/min
Potassium: 5.3 mEq/L (ref 3.5–5.3)
Total Bilirubin: 1.3 mg/dL — ABNORMAL HIGH (ref 0.3–1.2)

## 2013-05-30 LAB — LIPID PANEL
Cholesterol: 300 mg/dL — ABNORMAL HIGH (ref 0–200)
HDL: 52 mg/dL (ref >39)
LDL Cholesterol: 227 mg/dL — ABNORMAL HIGH (ref 0–99)
Triglycerides: 106 mg/dL (ref <150)
VLDL: 21 mg/dL (ref 0–40)

## 2013-05-30 MED ORDER — ALPRAZOLAM 0.25 MG PO TABS: 0.2500 mg | ORAL_TABLET | Freq: Every evening | ORAL | Status: DC | PRN

## 2013-05-30 NOTE — Progress Notes (Signed)
  Subjective:    Patient ID: Russell Spencer, male    DOB: Mar 31, 1966, 47 y.o.   MRN: [id number]  HPI Here for CPE.  Doing well with his anxiety. Hasn't had ot use his xanx as well.    Right anterior shoulder pain x 3 weeks. Painful to bench press on his back and when crosses arm over. Can reach above his head with no problems. Getting a litlte better gradually. Has been decreasing his weights.   Has been doing ROM stretches. No meds or NSAIDS.     Review of Systems Comprehensive ROS is neg.     Objective:   Physical Exam  Constitutional: He is oriented to person, place, and time. He appears well-developed and well-nourished.  HENT:  Head: Normocephalic and atraumatic.  Right Ear: External ear normal.  Left Ear: External ear normal.  Nose: Nose normal.  Mouth/Throat: Oropharynx is clear and moist.  Eyes: Conjunctivae and EOM are normal. Pupils are equal, round, and reactive to light.  Neck: Normal range of motion. Neck supple. No thyromegaly present.  Cardiovascular: Normal rate, regular rhythm, normal heart sounds and intact distal pulses.   Pulmonary/Chest: Effort normal and breath sounds normal.  Abdominal: Soft. Bowel sounds are normal. He exhibits no distension and no mass. There is no tenderness. There is no rebound and no guarding.  Musculoskeletal: Normal range of motion.  Lymphadenopathy:    He has no cervical adenopathy.  Neurological: He is alert and oriented to person, place, and time. He has normal reflexes.  Skin: Skin is warm and dry.  Psychiatric: He has a normal mood and affect. His behavior is normal. Judgment and thought content normal.    Right shoulder with NORM. Tender over the long head of the bicep tendon.  Pain with crossover. Neg empty can test.  + Speers.  Strenght in the shoulder is 5/5 bilat.        Assessment & Plan:  CPE Keep up a regular exercise program and make sure you are eating a healthy diet Try to eat 4 servings of dairy a day, or if you  are lactose intolerant take a calcium with vitamin D daily.  Your vaccines are up to date.  BP looks great Declined tdap again today. He says he will at least think about it.  Right shoulder pain - Biceps tendonitis.  Handout given. Recommend anti-inflammatory for at least 5-6 days to try reduce inflammation around the tendon. He definitely needs to decrease his weight well working out over the next few weeks to give this a chance to heal. If she's not improving over the next 3 weeks and please call us back clinic and refer him to my partner Dr. Rodney Langton for further evaluation and treatment and possibly physical therapy.  HTN - well controlled.

## 2013-05-30 NOTE — Patient Instructions (Addendum)
Keep up a regular exercise program and make sure you are eating a healthy diet Try to eat 4 servings of dairy a day, or if you are lactose intolerant take a calcium with vitamin D daily.  Your vaccines are up to date.  Recommend Ibuprofen 600mg  3 x a day with food and water for about 5 days to reduce swelling and inflamation You have biceps tendonitis - follow the exercises

## 2013-05-31 ENCOUNTER — Other Ambulatory Visit: Payer: Self-pay | Admitting: Family Medicine

## 2013-05-31 ENCOUNTER — Other Ambulatory Visit: Payer: Self-pay | Admitting: *Deleted

## 2013-05-31 MED ORDER — ATORVASTATIN CALCIUM 40 MG PO TABS: ORAL_TABLET | ORAL | Status: DC

## 2013-06-03 ENCOUNTER — Other Ambulatory Visit: Payer: Self-pay | Admitting: Family Medicine

## 2013-06-03 MED ORDER — ATORVASTATIN CALCIUM 40 MG PO TABS: ORAL_TABLET | ORAL | Status: DC

## 2013-06-04 NOTE — Progress Notes (Signed)
Quick Note:  All labs are normal. ______ 

## 2013-06-17 ENCOUNTER — Ambulatory Visit (INDEPENDENT_AMBULATORY_CARE_PROVIDER_SITE_OTHER): Payer: BC Managed Care – PPO | Admitting: Family Medicine

## 2013-06-17 ENCOUNTER — Encounter: Payer: Self-pay | Admitting: Family Medicine

## 2013-06-17 VITALS — BP 119/80 | HR 80 | Wt 156.0 lb

## 2013-06-17 DIAGNOSIS — G44209 Tension-type headache, unspecified, not intractable: Secondary | ICD-10-CM

## 2013-06-17 NOTE — Progress Notes (Signed)
CC: Russell Spencer is a 47 y.o. male is here for Headache   Subjective: HPI:  Patient complains of a headache that's been present for the last week is localized on both sides of the head around the temporal region as described as a moderate tension. It is present only soon after he arrived at work and improves significantly after he gets home. Interestingly it does not occur if he is not at work. Pain is nonradiating. No interventions as of yet. He denies fevers, chills, nasal congestion, facial pain, sore throat, cough, wheezing, hearing loss, or motor or sensory disturbances   Review Of Systems Outlined In HPI  Past Medical History  Diagnosis Date  . Anxiety      Family History  Problem Relation Age of Onset  . Diabetes Mother   . Hypertension Mother   . Hyperlipidemia Mother   . Kidney failure Mother     Dialysis     History  Substance Use Topics  . Smoking status: Never Smoker   . Smokeless tobacco: Not on file  . Alcohol Use: No     Objective: Filed Vitals:   06/17/13 1419  BP: 119/80  Pulse: 80    General: Alert and Oriented, No Acute Distress HEENT: Pupils equal, round, reactive to light. Conjunctivae clear.  External ears unremarkable, canals clear with intact TMs with appropriate landmarks.  Middle ear appears open without effusion. Pink inferior turbinates.  Moist mucous membranes, pharynx without inflammation nor lesions.  Neck supple without palpable lymphadenopathy nor abnormal masses. Neuro: Cranial nerves II through XII grossly intact Lungs: Clear to auscultation bilaterally, no wheezing/ronchi/rales.  Comfortable work of breathing. Good air movement. Cardiac: Regular rate and rhythm. Normal S1/S2.  No murmurs, rubs, nor gallops.  No carotid bruit Mental Status: No depression, anxiety, nor agitation. Skin: Warm and dry.  Assessment & Plan: Russell Spencer was seen today for headache.  Diagnoses and associated orders for this visit:  Tension  headache    Discuss my suspicion of tension headache treatment including ibuprofen 800 mg 3 times a day as needed, before the above he is going to take tomorrow off and returned to work on Wednesday, if the headache continues to behave in a sense of only appearing at work he will start ibuprofen and we'll consider amitriptyline if not improving  Return if symptoms worsen or fail to improve.

## 2013-09-23 ENCOUNTER — Other Ambulatory Visit: Payer: Self-pay | Admitting: Family Medicine

## 2013-11-27 ENCOUNTER — Ambulatory Visit: Payer: BC Managed Care – PPO | Admitting: Family Medicine

## 2013-12-20 ENCOUNTER — Telehealth: Payer: Self-pay | Admitting: *Deleted

## 2013-12-20 NOTE — Telephone Encounter (Signed)
Pt needed an appt.Laureen Ochs.Kayler Buckholtz, Viann Shoveonya Lynetta

## 2013-12-23 ENCOUNTER — Encounter: Payer: Self-pay | Admitting: Family Medicine

## 2013-12-23 ENCOUNTER — Ambulatory Visit (INDEPENDENT_AMBULATORY_CARE_PROVIDER_SITE_OTHER): Payer: BC Managed Care – PPO | Admitting: Family Medicine

## 2013-12-23 VITALS — BP 144/84 | HR 90 | Wt 161.0 lb

## 2013-12-23 DIAGNOSIS — L301 Dyshidrosis [pompholyx]: Secondary | ICD-10-CM

## 2013-12-23 MED ORDER — CLOBETASOL PROPIONATE 0.05 % EX FOAM: Freq: Two times a day (BID) | CUTANEOUS | Status: DC | PRN

## 2013-12-23 MED ORDER — AMBULATORY NON FORMULARY MEDICATION: Status: DC

## 2013-12-23 NOTE — Progress Notes (Signed)
CC: Russell Spencer is a 48 y.o. male is here for rash on hands   Subjective: HPI:  Complains of rash on left hand localized to the palm at the base of the thumb has been present for 3 months has not been getting better or worsen since onset. Nothing particularly makes better or worse other than spreading the more often he washes his hands.. Painless and does not bother him with respect to itch or discomfort.  Had identical lesions on both fifth fingers and on the bottom of his feet over 2 years ago which responded within weeks to topical clobetasol and epiceram.  Denies fevers, chills, shortness of breath, flushing, nor skin abnormality is elsewhere in the body  Review Of Systems Outlined In HPI  Past Medical History  Diagnosis Date  . Anxiety     No past surgical history on file. Family History  Problem Relation Age of Onset  . Diabetes Mother   . Hypertension Mother   . Hyperlipidemia Mother   . Kidney failure Mother     Dialysis    History   Social History  . Marital Status: Married    Spouse Name: N/A    Number of Children: 3  . Years of Education: N/A   Occupational History  .      [employer]    Social History Main Topics  . Smoking status: Never Smoker   . Smokeless tobacco: Not on file  . Alcohol Use: No  . Drug Use: No  . Sexual Activity: Yes    Partners: Female   Other Topics Concern  . Not on file   Social History Narrative   Some exercise, 2 x a week.       Objective: BP 144/84  Pulse 90  Wt 161 lb (73.029 kg)  General: Alert and Oriented, No Acute Distress HEENT: Pupils equal, round, reactive to light. Conjunctivae clear. Moist membranes pharynx unremarkable Lungs: Clear to auscultation bilaterally, no wheezing/ronchi/rales.  Comfortable work of breathing. Good air movement. Mental Status: No depression, anxiety, nor agitation. Skin: Warm and dry. On the palm of the left hand at the thenar eminence there is a patch of 1 cm x 3 cm hyperkeratosis and  flaking with no erythema  Assessment & Plan: Russell Spencer was seen today for rash on hands.  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 - AMBULATORY NON FORMULARY MEDICATION; Epiceram: apply to affected skin twice a day as needed for irritation.    Dyshidrotic eczema: Uncontrolled restart clobetasol and epicream, encouraged to avoid lifting weights without wearing gloves   Return if symptoms worsen or fail to improve.

## 2014-04-30 ENCOUNTER — Ambulatory Visit (INDEPENDENT_AMBULATORY_CARE_PROVIDER_SITE_OTHER): Payer: BC Managed Care – PPO | Admitting: Physician Assistant

## 2014-04-30 ENCOUNTER — Other Ambulatory Visit: Payer: Self-pay | Admitting: Physician Assistant

## 2014-04-30 ENCOUNTER — Encounter: Payer: Self-pay | Admitting: Physician Assistant

## 2014-04-30 VITALS — BP 122/72 | HR 67 | Ht 70.0 in | Wt 160.0 lb

## 2014-04-30 DIAGNOSIS — Z13220 Encounter for screening for lipoid disorders: Secondary | ICD-10-CM

## 2014-04-30 DIAGNOSIS — Z131 Encounter for screening for diabetes mellitus: Secondary | ICD-10-CM

## 2014-04-30 DIAGNOSIS — M542 Cervicalgia: Secondary | ICD-10-CM

## 2014-04-30 DIAGNOSIS — Z0000 Encounter for general adult medical examination without abnormal findings: Secondary | ICD-10-CM

## 2014-04-30 LAB — COMPLETE METABOLIC PANEL WITH GFR
ALT: 48 U/L (ref 0–53)
AST: 26 U/L (ref 0–37)
Albumin: 4.9 g/dL (ref 3.5–5.2)
Alkaline Phosphatase: 73 U/L (ref 39–117)
BUN: 10 mg/dL (ref 6–23)
CO2: 30 mEq/L (ref 19–32)
CREATININE: 1.05 mg/dL (ref 0.50–1.35)
Calcium: 10.5 mg/dL (ref 8.4–10.5)
Chloride: 96 mEq/L (ref 96–112)
GFR, EST NON AFRICAN AMERICAN: 84 mL/min
GFR, Est African American: >89 mL/min
Glucose, Bld: 97 mg/dL (ref 70–99)
Potassium: 4.8 mEq/L (ref 3.5–5.3)
Sodium: 138 mEq/L (ref 135–145)
Total Bilirubin: 1.4 mg/dL — ABNORMAL HIGH (ref 0.2–1.2)
Total Protein: 7.4 g/dL (ref 6.0–8.3)

## 2014-04-30 LAB — CBC WITH DIFFERENTIAL/PLATELET
Basophils Absolute: 0 10*3/uL (ref 0.0–0.1)
Basophils Relative: 0 % (ref 0–1)
Eosinophils Absolute: 0 10*3/uL (ref 0.0–0.7)
Eosinophils Relative: 1 % (ref 0–5)
HEMATOCRIT: 45.7 % (ref 39.0–52.0)
Hemoglobin: 15.2 g/dL (ref 13.0–17.0)
LYMPHS ABS: 1.7 10*3/uL (ref 0.7–4.0)
LYMPHS PCT: 54 % — AB (ref 12–46)
MCH: 30.3 pg (ref 26.0–34.0)
MCHC: 33.3 g/dL (ref 30.0–36.0)
MCV: 91.2 fL (ref 78.0–100.0)
MONO ABS: 0.4 10*3/uL (ref 0.1–1.0)
Monocytes Relative: 13 % — ABNORMAL HIGH (ref 3–12)
Neutro Abs: 1 10*3/uL — ABNORMAL LOW (ref 1.7–7.7)
Neutrophils Relative %: 32 % — ABNORMAL LOW (ref 43–77)
PLATELETS: 253 10*3/uL (ref 150–400)
RBC: 5.01 MIL/uL (ref 4.22–5.81)
RDW: 12.6 % (ref 11.5–15.5)
WBC: 3.2 10*3/uL — AB (ref 4.0–10.5)

## 2014-04-30 LAB — LIPID PANEL
CHOLESTEROL: 188 mg/dL (ref 0–200)
HDL: 54 mg/dL (ref >39)
LDL Cholesterol: 118 mg/dL — ABNORMAL HIGH (ref 0–99)
TRIGLYCERIDES: 78 mg/dL (ref <150)
Total CHOL/HDL Ratio: 3.5 Ratio
VLDL: 16 mg/dL (ref 0–40)

## 2014-04-30 NOTE — Patient Instructions (Signed)
Start Zyrtec 10mg  daily  Keeping you healthy  Get these tests  Blood pressure- Have your blood pressure checked once a year by your healthcare provider.  Normal blood pressure is 120/80.  Weight- Have your body mass index (BMI) calculated to screen for obesity.  BMI is a measure of body fat based on height and weight. You can also calculate your own BMI at https://www.west-esparza.com/www.nhlbisupport.com/bmi/.  Cholesterol- Have your cholesterol checked regularly starting at age 48, sooner may be necessary if you have diabetes, high blood pressure, if a family member developed heart diseases at an early age or if you smoke.   Chlamydia, HIV, and other sexual transmitted disease- Get screened each year until the age of 48 then within three months of each new sexual partner.  Diabetes- Have your blood sugar checked regularly if you have high blood pressure, high cholesterol, a family history of diabetes or if you are overweight.  Get these vaccines  Flu shot- Every fall.  Tetanus shot- Every 10 years.  Menactra- Single dose; prevents meningitis.  Take these steps  Don't smoke- If you do smoke, ask your healthcare provider about quitting. For tips on how to quit, go to www.smokefree.gov or call 1-800-QUIT-NOW.  Be physically active- Exercise 5 days a week for at least 30 minutes.  If you are not already physically active start slow and gradually work up to 30 minutes of moderate physical activity.  Examples of moderate activity include walking briskly, mowing the yard, dancing, swimming bicycling, etc.  Eat a healthy diet- Eat a variety of healthy foods such as fruits, vegetables, low fat milk, low fat cheese, yogurt, lean meats, poultry, fish, beans, tofu, etc.  For more information on healthy eating, go to www.thenutritionsource.org  Drink alcohol in moderation- Limit alcohol intake two drinks or less a day.  Never drink and drive.  Dentist- Brush and floss teeth twice daily; visit your dentis twice a  year.  Depression-Your emotional health is as important as your physical health.  If you're feeling down, losing interest in things you normally enjoy please talk with your healthcare provider.  Gun Safety- If you keep a gun in your home, keep it unloaded and with the safety lock on.  Bullets should be stored separately.  Helmet use- Always wear a helmet when riding a motorcycle, bicycle, rollerblading or skateboarding.  Safe sex- If you may be exposed to a sexually transmitted infection, use a condom  Seat belts- Seat bels can save your life; always wear one.  Smoke/Carbon Monoxide detectors- These detectors need to be installed on the appropriate level of your home.  Replace batteries at least once a year.  Skin Cancer- When out in the sun, cover up and use sunscreen SPF 15 or higher.  Violence- If anyone is threatening or hurting you, please tell your healthcare provider.

## 2014-05-01 ENCOUNTER — Other Ambulatory Visit: Payer: Self-pay | Admitting: *Deleted

## 2014-05-01 DIAGNOSIS — D729 Disorder of white blood cells, unspecified: Secondary | ICD-10-CM

## 2014-05-01 LAB — TSH: TSH: 1.876 u[IU]/mL (ref 0.350–4.500)

## 2014-05-01 MED ORDER — ATORVASTATIN CALCIUM 40 MG PO TABS: ORAL_TABLET | ORAL | Status: DC

## 2014-05-05 LAB — PATHOLOGIST SMEAR REVIEW

## 2014-05-05 NOTE — Progress Notes (Signed)
   Subjective:    Patient ID: Russell Spencer, male    DOB: May 17, 1966, 48 y.o.   MRN: [id number]  HPI  Patient is a 48 year old male who presents to the clinic for complete physical. He does come in fasting today.  He does have one complaint of some right-sided neck discomfort right under the right jaw for the last 2 weeks. He denies feelings of swelling or pain with touch. He denies any problems swallowing. He denies fever, chills, nausea, vomiting or sore throat. He denies any ear pain but has had some popping of the right ear. He has not tried anything to make better. He denies once again that there is pain but only at discomfort.  Review of Systems  All other systems reviewed and are negative.      Objective:   Physical Exam  BP 122/72  Pulse 67  Ht 5\' 10"  (1.778 m)  Wt 160 lb (72.576 kg)  BMI 22.96 kg/m2  General Appearance:    Alert, cooperative, no distress, appears stated age  Head:    Normocephalic, without obvious abnormality, atraumatic  Eyes:    PERRL, conjunctiva/corneas clear, EOM's intact, fundi    benign, both eyes       Ears:    Normal TM's and external ear canals, both ears  Nose:   Nares normal, septum midline, mucosa normal, no drainage    or sinus tenderness  Throat:   Lips, mucosa, and tongue normal; teeth and gums normal  Neck:   Supple, symmetrical, trachea midline, no adenopathy;       thyroid:  No enlargement/tenderness/nodules; no carotid   bruit or JVD. No pain over area of discomfort to palpation along the right cervical lymph nodes.   Back:     Symmetric, no curvature, ROM normal, no CVA tenderness  Lungs:     Clear to auscultation bilaterally, respirations unlabored  Chest wall:    No tenderness or deformity  Heart:    Regular rate and rhythm, S1 and S2 normal, no murmur, rub   or gallop  Abdomen:     Soft, non-tender, bowel sounds active all four quadrants,    no masses, no organomegaly  Genitalia:  Not done  Rectal:    Not done  Extremities:    Extremities normal, atraumatic, no cyanosis or edema  Pulses:   2+ and symmetric all extremities  Skin:   Skin color, texture, turgor normal, no rashes or lesions  Lymph nodes:   Cervical, supraclavicular, and axillary nodes normal  Neurologic:   CNII-XII intact. Normal strength, sensation and reflexes      throughout        Assessment & Plan:  CPE-vaccines are not up-to-date. Patient has repeatedly to climb tetanus vaccine as well as flu shot. His very adamant that he does not want to take any vaccines. Screening labs were ordered today. Patient denies any urinary symptoms or concerns. He declines genital exam. Will order PSA. Encouraged patient to continue with regular exercise only 4 times a week and healthy balanced diet.  Right-sided neck discomfort-etiology unclear at this point. I did not feel any lymph node enlargement. Will check thyroid levels today as well as get a CBC. Consider Zyrtec 10 mg daily if there is some type of allergy component as well as ibuprofen for the next couple days. If not improving can certainly consider neck ultrasound. Please call if symptoms change or worsening.

## 2014-05-14 LAB — CBC WITH DIFFERENTIAL/PLATELET
Basophils Absolute: 0 10*3/uL (ref 0.0–0.1)
Basophils Relative: 0 % (ref 0–1)
Eosinophils Absolute: 0 10*3/uL (ref 0.0–0.7)
Eosinophils Relative: 1 % (ref 0–5)
HCT: 44.7 % (ref 39.0–52.0)
Hemoglobin: 14.6 g/dL (ref 13.0–17.0)
LYMPHS ABS: 2.6 10*3/uL (ref 0.7–4.0)
LYMPHS PCT: 61 % — AB (ref 12–46)
MCH: 30.2 pg (ref 26.0–34.0)
MCHC: 32.7 g/dL (ref 30.0–36.0)
MCV: 92.4 fL (ref 78.0–100.0)
Monocytes Absolute: 0.4 10*3/uL (ref 0.1–1.0)
Monocytes Relative: 9 % (ref 3–12)
NEUTROS PCT: 29 % — AB (ref 43–77)
Neutro Abs: 1.2 10*3/uL — ABNORMAL LOW (ref 1.7–7.7)
Platelets: 245 10*3/uL (ref 150–400)
RBC: 4.84 MIL/uL (ref 4.22–5.81)
RDW: 12.5 % (ref 11.5–15.5)
WBC: 4.2 10*3/uL (ref 4.0–10.5)

## 2014-05-16 ENCOUNTER — Other Ambulatory Visit: Payer: Self-pay | Admitting: Family Medicine

## 2014-05-26 ENCOUNTER — Encounter: Payer: Self-pay | Admitting: Family Medicine

## 2014-05-26 ENCOUNTER — Ambulatory Visit (INDEPENDENT_AMBULATORY_CARE_PROVIDER_SITE_OTHER): Payer: BC Managed Care – PPO | Admitting: Family Medicine

## 2014-05-26 VITALS — BP 131/85 | HR 77 | Ht 70.0 in | Wt 160.0 lb

## 2014-05-26 DIAGNOSIS — M542 Cervicalgia: Secondary | ICD-10-CM | POA: Diagnosis not present

## 2014-05-26 NOTE — Progress Notes (Signed)
   Subjective:    Patient ID: Russell Spencer, male    DOB: May 11, 1966, 48 y.o.   MRN: [id number]  HPI Right sided neck discomfort under right jaw x 6 weeks at this point. Says not really painful. Feels more like a fullness and pressure. Had coplained of this when came in for CPE 4 weeks ago  Had normal thyroid levels. WBC were a little low. Repeated in 2 weeks and had come up but neutrphils were still low  Now feels a lump.  No fever, chills or sweat. No travel outside the country. Does have reflux. No voice changes.  Neck is not tender to palpation.      Review of Systems     Objective:   Physical Exam  Constitutional: He is oriented to person, place, and time. He appears well-developed.  HENT:  Head: Normocephalic and atraumatic.  Right Ear: External ear normal.  Left Ear: External ear normal.  Nose: Nose normal.  Mouth/Throat: Oropharynx is clear and moist.  Eyes: Conjunctivae and EOM are normal. Pupils are equal, round, and reactive to light.  Neck: Neck supple. No thyromegaly present.  Right upper cervical LN is slightly larger than left side but not hard or indurated.  No ertyehma.  No other lesion, masses detected  Lymphadenopathy:    He has no cervical adenopathy.  Neurological: He is alert and oriented to person, place, and time.  Skin: Skin is warm and dry.  Psychiatric: He has a normal mood and affect. His behavior is normal.          Assessment & Plan:  Right sided neck pain/pressure - Recommend referral to ENT.

## 2014-06-13 ENCOUNTER — Other Ambulatory Visit: Payer: Self-pay | Admitting: *Deleted

## 2014-06-13 DIAGNOSIS — D729 Disorder of white blood cells, unspecified: Secondary | ICD-10-CM

## 2014-06-13 LAB — CBC
HEMATOCRIT: 44.4 % (ref 39.0–52.0)
Hemoglobin: 14.9 g/dL (ref 13.0–17.0)
MCH: 30.4 pg (ref 26.0–34.0)
MCHC: 33.6 g/dL (ref 30.0–36.0)
MCV: 90.6 fL (ref 78.0–100.0)
PLATELETS: 253 10*3/uL (ref 150–400)
RBC: 4.9 MIL/uL (ref 4.22–5.81)
RDW: 12.9 % (ref 11.5–15.5)
WBC: 4.3 10*3/uL (ref 4.0–10.5)

## 2014-06-15 NOTE — Progress Notes (Signed)
Quick Note:  All labs are normal. ______ 

## 2014-11-01 ENCOUNTER — Other Ambulatory Visit: Payer: Self-pay | Admitting: Family Medicine

## 2015-01-09 ENCOUNTER — Encounter: Payer: Self-pay | Admitting: Family Medicine

## 2015-01-09 ENCOUNTER — Ambulatory Visit (INDEPENDENT_AMBULATORY_CARE_PROVIDER_SITE_OTHER): Payer: BC Managed Care – PPO | Admitting: Family Medicine

## 2015-01-09 VITALS — BP 132/86 | HR 81 | Wt 159.0 lb

## 2015-01-09 DIAGNOSIS — M79643 Pain in unspecified hand: Secondary | ICD-10-CM | POA: Diagnosis not present

## 2015-01-09 DIAGNOSIS — L301 Dyshidrosis [pompholyx]: Secondary | ICD-10-CM

## 2015-01-09 MED ORDER — CLOBETASOL PROPIONATE 0.05 % EX CREA: 1.0000 "application " | TOPICAL_CREAM | Freq: Two times a day (BID) | CUTANEOUS | Status: DC

## 2015-01-09 MED ORDER — PREDNISONE 20 MG PO TABS: ORAL_TABLET | ORAL | Status: AC

## 2015-01-09 MED ORDER — CLOBETASOL PROPIONATE 0.05 % EX FOAM: Freq: Two times a day (BID) | CUTANEOUS | Status: DC | PRN

## 2015-01-09 NOTE — Progress Notes (Signed)
CC: Russell Spencer is a 49 y.o. male is here for Hand Pain   Subjective: HPI:  Bilateral hand pain that has been present for the past 3 months. It's localized in all the knuckles and joints in the fingers. It's present on almost daily basis mild in severity at its worst. It's worse at the end of the day especially if he was lifting weights that day. Seems to be worse with gripping. Has not improved with use of occasional nonsteroidal anti-inflammatories. No other interventions as of yet. Denies joint pain elsewhere. He's never had this before. He denies swelling redness or warmth of any joint that seems to be affected. Pain is absent first thing in the morning and for the most part of the day. Denies any weakness or any other motor or sensory disturbances. Pain is localized on and dorsal surface of the hand he denies any numbness.  Complains of worsening scaling on the palm of his left hand. He's had this for many years now it seems to wax and wane. It's worse the more friction he applies to the hand. It used to improve with only moisturizers however moisturizes her currently ineffective, he requests topical steroids that have worked well in the past. It's painless but itchy. Denies skin changes elsewhere. No fevers, chills or swollen lymph nodes  Review Of Systems Outlined In HPI  Past Medical History  Diagnosis Date  . Anxiety     No past surgical history on file. Family History  Problem Relation Age of Onset  . Diabetes Mother   . Hypertension Mother   . Hyperlipidemia Mother   . Kidney failure Mother     Dialysis    History   Social History  . Marital Status: Married    Spouse Name: N/A  . Number of Children: 3  . Years of Education: N/A   Occupational History  .      [employer]    Social History Main Topics  . Smoking status: Never Smoker   . Smokeless tobacco: Not on file  . Alcohol Use: No  . Drug Use: No  . Sexual Activity:    Partners: Female   Other Topics Concern  .  Not on file   Social History Narrative   Some exercise, 2 x a week.       Objective: BP 132/86 mmHg  Pulse 81  Wt 159 lb (72.122 kg)  General: Alert and Oriented, No Acute Distress HEENT: Pupils equal, round, reactive to light. Conjunctivae clear.  Moist mucous membranes Lungs:clearing comfortable work of breathing Cardiac: Regular rate and rhythm.  Extremities: No peripheral edema.  Strong peripheral pulses.  Mental Status: No depression, anxiety, nor agitation. Skin: Warm and dry.full range of motion and strength of all the joints in both hands. There is no swelling redness or warmth of any of the knuckles or fingers. I cannot elicit any of his pain with palpation of the joints or compression. Mild eczematous changes in the hypothenar region of the left hand  Assessment & Plan: Nahuel was seen today for hand pain.  Diagnoses and all orders for this visit:  Pain of hand, unspecified laterality Orders: -     predniSONE (DELTASONE) 20 MG tablet; Three tabs daily days 1-3, two tabs daily days 4-6, one tab daily days 7-9, half tab daily days 10-13.  DYSHIDROTIC ECZEMA Orders: -     clobetasol (OLUX) 0.05 % topical foam; Apply topically 2 (two) times daily as needed.  Other orders -  clobetasol cream (TEMOVATE) 0.05 %; Apply 1 application topically 2 (two) times daily.   Bilateral hand pain: Suspect this is due to mild arthritis due to overuse with weight lifting. Discussed avoiding all weightlifting activities that require gripping for the next 2 weeks. Begin prednisone taper since nonsteroidals have been ineffective. Dyshidrotic eczema: Worsening chronic condition provided with clobetasol, in the past the most effective regimen had been cream first followed by foam,he requests both. I've asked him to follow up with his PCP should this pain return or any other joint pain since this would raise the suspicion for psoriatic arthritis.  Return if symptoms worsen or fail to  improve.

## 2015-01-20 ENCOUNTER — Other Ambulatory Visit: Payer: Self-pay | Admitting: Family Medicine

## 2015-03-10 ENCOUNTER — Other Ambulatory Visit: Payer: Self-pay | Admitting: Family Medicine

## 2015-05-02 ENCOUNTER — Other Ambulatory Visit: Payer: Self-pay | Admitting: Sports Medicine

## 2015-06-23 ENCOUNTER — Encounter: Payer: BC Managed Care – PPO | Admitting: Family Medicine

## 2015-06-29 ENCOUNTER — Other Ambulatory Visit: Payer: Self-pay | Admitting: Family Medicine

## 2015-06-29 NOTE — Telephone Encounter (Signed)
Called and informed pt that he will need to make f/u appt for BP and labs. He stated that he would call back to schedule appt tomorrow.Laureen Ochs, Viann Shove

## 2015-07-10 ENCOUNTER — Ambulatory Visit (INDEPENDENT_AMBULATORY_CARE_PROVIDER_SITE_OTHER): Payer: BC Managed Care – PPO | Admitting: Family Medicine

## 2015-07-10 ENCOUNTER — Encounter: Payer: Self-pay | Admitting: Family Medicine

## 2015-07-10 VITALS — BP 131/79 | HR 80 | Ht 70.0 in | Wt 157.0 lb

## 2015-07-10 DIAGNOSIS — Z114 Encounter for screening for human immunodeficiency virus [HIV]: Secondary | ICD-10-CM | POA: Diagnosis not present

## 2015-07-10 DIAGNOSIS — E785 Hyperlipidemia, unspecified: Secondary | ICD-10-CM

## 2015-07-10 DIAGNOSIS — Z0000 Encounter for general adult medical examination without abnormal findings: Secondary | ICD-10-CM | POA: Diagnosis not present

## 2015-07-10 DIAGNOSIS — R030 Elevated blood-pressure reading, without diagnosis of hypertension: Secondary | ICD-10-CM | POA: Diagnosis not present

## 2015-07-10 MED ORDER — LISINOPRIL-HYDROCHLOROTHIAZIDE 10-12.5 MG PO TABS: 1.0000 | ORAL_TABLET | Freq: Every day | ORAL | Status: DC

## 2015-07-10 NOTE — Progress Notes (Signed)
Subjective:    Patient ID: Russell Spencer, male    DOB: 07/29/1966, 49 y.o.   MRN: [id number]  HPI Here for CPE. Still working out 5 days per week.  No CP or SOB with exercise.    Hyperlipidemia - Stopped his statin  About 6 months ago.    Hypertension- Pt denies chest pain, SOB, dizziness, or heart palpitations.  Taking meds as directed w/o problems.  Denies medication side effects.     Review of Systems Comprehensive ROS is neg.       There were no vitals taken for this visit.    Allergies  Allergen Reactions  . Penicillins     REACTION: rash    Past Medical History  Diagnosis Date  . Anxiety     No past surgical history on file.  Social History   Social History  . Marital Status: Married    Spouse Name: N/A  . Number of Children: 3  . Years of Education: N/A   Occupational History  .      [employer]    Social History Main Topics  . Smoking status: Never Smoker   . Smokeless tobacco: Not on file  . Alcohol Use: No  . Drug Use: No  . Sexual Activity:    Partners: Female   Other Topics Concern  . Not on file   Social History Narrative   Some exercise, 2 x a week.      Family History  Problem Relation Age of Onset  . Diabetes Mother   . Hypertension Mother   . Hyperlipidemia Mother   . Kidney failure Mother     Dialysis    Outpatient Encounter Prescriptions as of 07/10/2015  Medication Sig  . atorvastatin (LIPITOR) 40 MG tablet TAKE 1 TABLET AT BEDTIME (Patient not taking: Reported on 01/09/2015)  . clobetasol (OLUX) 0.05 % topical foam Apply topically 2 (two) times daily as needed.  . clobetasol cream (TEMOVATE) 0.05 % Apply 1 application topically 2 (two) times daily.  Marland Kitchen lisinopril-hydrochlorothiazide (PRINZIDE,ZESTORETIC) 10-12.5 MG per tablet TAKE 1 TABLET BY MOUTH EVERY DAY   No facility-administered encounter medications on file as of 07/10/2015.       Objective:   Physical Exam  Constitutional: He is oriented to person, place, and time.  He appears well-developed and well-nourished.  HENT:  Head: Normocephalic and atraumatic.  Right Ear: External ear normal.  Left Ear: External ear normal.  Nose: Nose normal.  Mouth/Throat: Oropharynx is clear and moist.  Eyes: Conjunctivae and EOM are normal. Pupils are equal, round, and reactive to light.  Neck: Normal range of motion. Neck supple. No thyromegaly present.  Cardiovascular: Normal rate, regular rhythm, normal heart sounds and intact distal pulses.   Pulmonary/Chest: Effort normal and breath sounds normal.  Abdominal: Soft. Bowel sounds are normal. He exhibits no distension and no mass. There is no tenderness. There is no rebound and no guarding.  Musculoskeletal: Normal range of motion.  Lymphadenopathy:    He has no cervical adenopathy.  Neurological: He is alert and oriented to person, place, and time. He has normal reflexes.  Skin: Skin is warm and dry.  Psychiatric: He has a normal mood and affect. His behavior is normal. Judgment and thought content normal.          Assessment & Plan:  CPE  Keep up a regular exercise program and make sure you are eating a healthy diet Try to eat 4 servings of dairy a day, or  if you are lactose intolerant take a calcium with vitamin D daily.  Your vaccines are up to date.   HTN - well controlled. meds refill x 6 months.  Due or labs including CMP and lipids.    Hyerlipidemia - will see how he does on half a tab of the lipitor.

## 2015-07-10 NOTE — Patient Instructions (Signed)
Keep up a regular exercise program and make sure you are eating a healthy diet Try to eat 4 servings of dairy a day, or if you are lactose intolerant take a calcium with vitamin D daily.  Your vaccines are up to date.   

## 2015-07-16 LAB — COMPLETE METABOLIC PANEL WITH GFR
ALT: 22 U/L (ref 9–46)
AST: 18 U/L (ref 10–40)
Albumin: 5 g/dL (ref 3.6–5.1)
Alkaline Phosphatase: 75 U/L (ref 40–115)
BUN: 16 mg/dL (ref 7–25)
CALCIUM: 10.5 mg/dL — AB (ref 8.6–10.3)
CHLORIDE: 95 mmol/L — AB (ref 98–110)
CO2: 30 mmol/L (ref 20–31)
CREATININE: 1.04 mg/dL (ref 0.60–1.35)
GFR, Est Non African American: 85 mL/min (ref >=60)
Glucose, Bld: 98 mg/dL (ref 65–99)
POTASSIUM: 4.9 mmol/L (ref 3.5–5.3)
Sodium: 138 mmol/L (ref 135–146)
Total Bilirubin: 1.1 mg/dL (ref 0.2–1.2)
Total Protein: 7.6 g/dL (ref 6.1–8.1)

## 2015-07-16 LAB — LIPID PANEL
CHOL/HDL RATIO: 5 ratio (ref <=5.0)
CHOLESTEROL: 260 mg/dL — AB (ref 125–200)
HDL: 52 mg/dL (ref >=40)
LDL CALC: 183 mg/dL — AB (ref <130)
TRIGLYCERIDES: 123 mg/dL (ref <150)
VLDL: 25 mg/dL (ref <30)

## 2015-07-16 LAB — HIV ANTIBODY (ROUTINE TESTING W REFLEX): HIV 1&2 Ab, 4th Generation: NONREACTIVE

## 2015-07-19 MED ORDER — ATORVASTATIN CALCIUM 20 MG PO TABS: 20.0000 mg | ORAL_TABLET | Freq: Every day | ORAL | Status: DC

## 2015-07-19 NOTE — Addendum Note (Signed)
Addended by: Nani Gasser D on: 07/19/2015 07:27 PM   Modules accepted: Orders

## 2015-12-03 ENCOUNTER — Other Ambulatory Visit: Payer: Self-pay | Admitting: Family Medicine

## 2016-01-07 ENCOUNTER — Ambulatory Visit: Payer: BC Managed Care – PPO | Admitting: Family Medicine

## 2016-02-11 ENCOUNTER — Encounter: Payer: Self-pay | Admitting: Family Medicine

## 2016-02-11 ENCOUNTER — Ambulatory Visit (INDEPENDENT_AMBULATORY_CARE_PROVIDER_SITE_OTHER): Payer: BC Managed Care – PPO | Admitting: Family Medicine

## 2016-02-11 VITALS — BP 145/90 | HR 70 | Wt 156.0 lb

## 2016-02-11 DIAGNOSIS — J069 Acute upper respiratory infection, unspecified: Secondary | ICD-10-CM | POA: Diagnosis not present

## 2016-02-11 MED ORDER — AZITHROMYCIN 250 MG PO TABS: 250.0000 mg | ORAL_TABLET | Freq: Every day | ORAL | Status: DC

## 2016-02-11 MED ORDER — IPRATROPIUM BROMIDE 0.06 % NA SOLN: 2.0000 | NASAL | Status: DC | PRN

## 2016-02-11 MED ORDER — PREDNISONE 10 MG PO TABS: 30.0000 mg | ORAL_TABLET | Freq: Every day | ORAL | Status: DC

## 2016-02-11 NOTE — Progress Notes (Signed)
       Russell Spencer is a 50 y.o. male who [facility] today for sore throat.  Symptoms present for 1 day. He notes a post nasal drainage. He denies any fevers, chills, NVD. He has not tried any medicines yet. No cough or congestion or itchy watery eyes. He notes a mild soreness and swelling on the right neck.   Past Medical History  Diagnosis Date  . Anxiety    No past surgical history on file. Social History  Substance Use Topics  . Smoking status: Never Smoker   . Smokeless tobacco: Not on file  . Alcohol Use: No   family history includes Diabetes in his mother; Hyperlipidemia in his mother; Hypertension in his mother; Kidney failure in his mother.  ROS as above Medications: Current Outpatient Prescriptions  Medication Sig Dispense Refill  . atorvastatin (LIPITOR) 20 MG tablet Take 1 tablet (20 mg total) by mouth at bedtime. TAKE 1 TABLET AT BEDTIME 90 tablet 3  . clobetasol (OLUX) 0.05 % topical foam APPLY TOPICALLY 2 TIMES DAILY AS NEEDED. 50 g 0  . lisinopril-hydrochlorothiazide (PRINZIDE,ZESTORETIC) 10-12.5 MG per tablet Take 1 tablet by mouth daily. 90 tablet 1  . azithromycin (ZITHROMAX) 250 MG tablet Take 1 tablet (250 mg total) by mouth daily. Take first 2 tablets together, then 1 every day until finished. 6 tablet 0  . ipratropium (ATROVENT) 0.06 % nasal spray Place 2 sprays into both nostrils every 4 (four) hours as needed for rhinitis. 10 mL 6  . predniSONE (DELTASONE) 10 MG tablet Take 3 tablets (30 mg total) by mouth daily with breakfast. 15 tablet 0   No current facility-administered medications for this visit.   Allergies  Allergen Reactions  . Penicillins     REACTION: rash     Exam:  BP 145/90 mmHg  Pulse 70  Wt 156 lb (70.761 kg) Gen: Well NAD HEENT: EOMI,  MMM clear nasal discharge. Posterior pharynx with cobblestoning. Normal tympanic  membranes bilaterally. Mild tender cervical lymphadenopathy right side Lungs: Normal work of breathing. CTABL Heart: RRR no MRG Abd: NABS, Soft. Nondistended, Nontender Exts: Brisk capillary refill, warm and well perfused.   No results found for this or any previous visit (from the past 24 hour(s)). No results found.   50 year old male with viral URI. Symptomatic management with Tylenol prednisone and Atrovent nasal spray. Use azithromycin if worsening. Recommend also over-the-counter allergy medicines. Recheck if not better.

## 2016-02-11 NOTE — Patient Instructions (Signed)
Thank you for coming in today. Call or go to the emergency room if you get worse, have trouble breathing, have chest pains, or palpitations.  Use the Atrovent nasal spray. Take prednisone daily for 5 days. If not better use the azithromycin antibiotics. Also take over-the-counter Zyrtec (cetirizine) allergy medicines. Call or go to the emergency room if you get worse, have trouble breathing, have chest pains, or palpitations.   Upper Respiratory Infection, Adult Most upper respiratory infections (URIs) are a viral infection of the air passages leading to the lungs. A URI affects the nose, throat, and upper air passages. The most common type of URI is nasopharyngitis and is typically referred to as "the common cold." URIs run their course and usually go away on their own. Most of the time, a URI does not require medical attention, but sometimes a bacterial infection in the upper airways can follow a viral infection. This is called a secondary infection. Sinus and middle ear infections are common types of secondary upper respiratory infections. Bacterial pneumonia can also complicate a URI. A URI can worsen asthma and chronic obstructive pulmonary disease (COPD). Sometimes, these complications can require emergency medical care and may be life threatening.  CAUSES Almost all URIs are caused by viruses. A virus is a type of germ and can spread from one person to another.  RISKS FACTORS You may be at risk for a URI if:   You smoke.   You have chronic heart or lung disease.  You have a weakened defense (immune) system.   You are very young or very old.   You have nasal allergies or asthma.  You work in crowded or poorly ventilated areas.  You work in health care facilities or schools. SIGNS AND SYMPTOMS  Symptoms typically develop 2-3 days after you come in contact with a cold virus. Most viral URIs last 7-10 days. However, viral URIs from the influenza virus (flu virus) can last 14-18  days and are typically more severe. Symptoms may include:   Runny or stuffy (congested) nose.   Sneezing.   Cough.   Sore throat.   Headache.   Fatigue.   Fever.   Loss of appetite.   Pain in your forehead, behind your eyes, and over your cheekbones (sinus pain).  Muscle aches.  DIAGNOSIS  Your health care provider may diagnose a URI by:  Physical exam.  Tests to check that your symptoms are not due to another condition such as:  Strep throat.  Sinusitis.  Pneumonia.  Asthma. TREATMENT  A URI goes away on its own with time. It cannot be cured with medicines, but medicines may be prescribed or recommended to relieve symptoms. Medicines may help:  Reduce your fever.  Reduce your cough.  Relieve nasal congestion. HOME CARE INSTRUCTIONS   Take medicines only as directed by your health care provider.   Gargle warm saltwater or take cough drops to comfort your throat as directed by your health care provider.  Use a warm mist humidifier or inhale steam from a shower to increase air moisture. This may make it easier to breathe.  Drink enough fluid to keep your urine clear or pale yellow.   Eat soups and other clear broths and maintain good nutrition.   Rest as needed.   Return to work when your temperature has returned to normal or as your health care provider advises. You may need to stay home longer to avoid infecting others. You can also use a face mask and careful  hand washing to prevent spread of the virus.  Increase the usage of your inhaler if you have asthma.   Do not use any tobacco products, including cigarettes, chewing tobacco, or electronic cigarettes. If you need help quitting, ask your health care provider. PREVENTION  The best way to protect yourself from getting a cold is to practice good hygiene.   Avoid oral or hand contact with people with cold symptoms.   Wash your hands often if contact occurs.  There is no clear  evidence that vitamin C, vitamin E, echinacea, or exercise reduces the chance of developing a cold. However, it is always recommended to get plenty of rest, exercise, and practice good nutrition.  SEEK MEDICAL CARE IF:   You are getting worse rather than better.   Your symptoms are not controlled by medicine.   You have chills.  You have worsening shortness of breath.  You have brown or red mucus.  You have yellow or brown nasal discharge.  You have pain in your face, especially when you bend forward.  You have a fever.  You have swollen neck glands.  You have pain while swallowing.  You have white areas in the back of your throat. SEEK IMMEDIATE MEDICAL CARE IF:   You have severe or persistent:  Headache.  Ear pain.  Sinus pain.  Chest pain.  You have chronic lung disease and any of the following:  Wheezing.  Prolonged cough.  Coughing up blood.  A change in your usual mucus.  You have a stiff neck.  You have changes in your:  Vision.  Hearing.  Thinking.  Mood. MAKE SURE YOU:   Understand these instructions.  Will watch your condition.  Will get help right away if you are not doing well or get worse.   This information is not intended to replace advice given to you by your health care provider. Make sure you discuss any questions you have with your health care provider.   Document Released: 04/12/2001 Document Revised: 03/03/2015 Document Reviewed: 01/22/2014 Elsevier Interactive Patient Education [employer]

## 2016-02-17 ENCOUNTER — Encounter: Payer: Self-pay | Admitting: Family Medicine

## 2016-02-17 ENCOUNTER — Ambulatory Visit (INDEPENDENT_AMBULATORY_CARE_PROVIDER_SITE_OTHER): Payer: BC Managed Care – PPO | Admitting: Family Medicine

## 2016-02-17 VITALS — BP 120/72 | HR 86 | Temp 98.4°F | Wt 153.0 lb

## 2016-02-17 DIAGNOSIS — J209 Acute bronchitis, unspecified: Secondary | ICD-10-CM | POA: Diagnosis not present

## 2016-02-17 MED ORDER — AZITHROMYCIN 250 MG PO TABS: 250.0000 mg | ORAL_TABLET | Freq: Every day | ORAL | Status: DC

## 2016-02-17 MED ORDER — BENZONATATE 200 MG PO CAPS: 200.0000 mg | ORAL_CAPSULE | Freq: Two times a day (BID) | ORAL | Status: DC | PRN

## 2016-02-17 NOTE — Progress Notes (Signed)
   Subjective:    Patient ID: Russell Spencer, male    DOB: Mar 12, 1966, 50 y.o.   MRN: [id number]  HPI patient comes in today complaining of sore throat and cough. 7 days ago he started with a sore throat and some postnasal drip. He came in after about 24 hours of symptoms. He was given a course of prednisone which didn't seem to really relieve his symptoms. He started developing a cough and the cough has progressed. Today he woke up feeling worse. He is getting productive sputum with the cough. He denies any fevers or chills or sweats. This is the cough is getting to the point where he feels like it's uncontrollable. He tried taking some Delsym and it did not work. He did take a dose of Mucinex last night he did breast little bit better. Today because he felt unwell.  Review of Systems     Objective:   Physical Exam  Constitutional: He is oriented to person, place, and time. He appears well-developed and well-nourished.  HENT:  Head: Normocephalic and atraumatic.  Right Ear: External ear normal.  Left Ear: External ear normal.  Nose: Nose normal.  Mouth/Throat: Oropharynx is clear and moist.  TMs and canals are clear.   Eyes: Conjunctivae and EOM are normal. Pupils are equal, round, and reactive to light.  Neck: Neck supple. No thyromegaly present.  1 cm swollen right anterior cerv LN.  Cardiovascular: Normal rate and normal heart sounds.   Pulmonary/Chest: Effort normal and breath sounds normal.  Lymphadenopathy:    He has cervical adenopathy.  Neurological: He is alert and oriented to person, place, and time.  Skin: Skin is warm and dry.  Psychiatric: He has a normal mood and affect.          Assessment & Plan:   Acute bronchitis- Explained most of the time this is viral and does not require antibiotics but since it has been 8 days and he actually feels like he is getting worse, will go ahead and treat him with azithromycin. Call if not better in one week.Also some perception for  [employer].

## 2016-03-17 ENCOUNTER — Encounter: Payer: BC Managed Care – PPO | Admitting: Family Medicine

## 2016-04-07 ENCOUNTER — Other Ambulatory Visit: Payer: Self-pay | Admitting: *Deleted

## 2016-04-07 ENCOUNTER — Other Ambulatory Visit: Payer: Self-pay | Admitting: Family Medicine

## 2016-04-15 ENCOUNTER — Encounter: Payer: BC Managed Care – PPO | Admitting: Family Medicine

## 2016-09-12 ENCOUNTER — Other Ambulatory Visit: Payer: Self-pay | Admitting: Family Medicine

## 2016-09-13 ENCOUNTER — Other Ambulatory Visit: Payer: Self-pay

## 2016-09-13 MED ORDER — LISINOPRIL-HYDROCHLOROTHIAZIDE 10-12.5 MG PO TABS: 1.0000 | ORAL_TABLET | Freq: Every day | ORAL | 0 refills | Status: DC

## 2016-09-19 ENCOUNTER — Encounter: Payer: Self-pay | Admitting: Family Medicine

## 2016-09-19 ENCOUNTER — Ambulatory Visit (INDEPENDENT_AMBULATORY_CARE_PROVIDER_SITE_OTHER): Payer: BC Managed Care – PPO | Admitting: Family Medicine

## 2016-09-19 VITALS — BP 118/78 | HR 85 | Ht 70.0 in | Wt 156.0 lb

## 2016-09-19 DIAGNOSIS — I10 Essential (primary) hypertension: Secondary | ICD-10-CM

## 2016-09-19 DIAGNOSIS — E7800 Pure hypercholesterolemia, unspecified: Secondary | ICD-10-CM | POA: Diagnosis not present

## 2016-09-19 DIAGNOSIS — F411 Generalized anxiety disorder: Secondary | ICD-10-CM

## 2016-09-19 DIAGNOSIS — Z125 Encounter for screening for malignant neoplasm of prostate: Secondary | ICD-10-CM

## 2016-09-19 LAB — LIPID PANEL
CHOL/HDL RATIO: 3.3 ratio (ref <5.0)
Cholesterol: 193 mg/dL (ref <200)
HDL: 58 mg/dL (ref >40)
LDL CALC: 117 mg/dL — AB (ref <100)
Triglycerides: 89 mg/dL (ref <150)
VLDL: 18 mg/dL (ref <30)

## 2016-09-19 LAB — COMPLETE METABOLIC PANEL WITH GFR
ALK PHOS: 75 U/L (ref 40–115)
ALT: 28 U/L (ref 9–46)
AST: 19 U/L (ref 10–35)
Albumin: 5.1 g/dL (ref 3.6–5.1)
BUN: 13 mg/dL (ref 7–25)
CO2: 30 mmol/L (ref 20–31)
Calcium: 10.3 mg/dL (ref 8.6–10.3)
Chloride: 99 mmol/L (ref 98–110)
Creat: 1.04 mg/dL (ref 0.70–1.33)
GFR, EST NON AFRICAN AMERICAN: 83 mL/min (ref >=60)
GFR, Est African American: >89 mL/min (ref >=60)
GLUCOSE: 100 mg/dL — AB (ref 65–99)
POTASSIUM: 4.2 mmol/L (ref 3.5–5.3)
SODIUM: 137 mmol/L (ref 135–146)
Total Bilirubin: 1.3 mg/dL — ABNORMAL HIGH (ref 0.2–1.2)
Total Protein: 7.8 g/dL (ref 6.1–8.1)

## 2016-09-19 LAB — PSA: PSA: 1.3 ng/mL (ref <=4.0)

## 2016-09-19 NOTE — Progress Notes (Signed)
Subjective:    CC: HTN  HPI: Hypertension- Pt denies chest pain, SOB, dizziness, or heart palpitations.  Taking meds as directed w/o problems.  Denies medication side effects.    Hyperlipidemia - Cranial currently on Lipitor 20 mg daily. No side effects or myalgias.  GAD -  Uses xanax PRN.  Has not had a panic attack in a long time.  Not using his xanax at all.    Past medical history, Surgical history, Family history not pertinant except as noted below, Social history, Allergies, and medications have been entered into the medical record, reviewed, and corrections made.   Review of Systems: No fevers, chills, night sweats, weight loss, chest pain, or shortness of breath.   Objective:    General: Well Developed, well nourished, and in no acute distress.  Neuro: Alert and oriented x3, extra-ocular muscles intact, sensation grossly intact.  HEENT: Normocephalic, atraumatic  Skin: Warm and dry, no rashes. Cardiac: Regular rate and rhythm, no murmurs rubs or gallops, no lower extremity edema.  Respiratory: Clear to auscultation bilaterally. Not using accessory muscles, speaking in full sentences.   Impression and Recommendations:    HTN - Well controlled. Continue current regimen. Follow-up in 6 months.  GAD - Very well-controlled. Stable.  Hyperlipidemia - Due to recheck lipids. Adjust medication if needed.  Prostate Ca Screen- check PSA.  Will do rectal test in 6 mo at f/U.   Colon cancer screening discussed-patient will do: Guarded. Form faxed today.

## 2016-10-18 ENCOUNTER — Other Ambulatory Visit: Payer: Self-pay | Admitting: Family Medicine

## 2016-11-28 ENCOUNTER — Ambulatory Visit (INDEPENDENT_AMBULATORY_CARE_PROVIDER_SITE_OTHER): Payer: BC Managed Care – PPO | Admitting: Physician Assistant

## 2016-11-28 ENCOUNTER — Encounter: Payer: Self-pay | Admitting: Physician Assistant

## 2016-11-28 VITALS — BP 144/89 | HR 88 | Temp 98.2°F | Wt 157.0 lb

## 2016-11-28 DIAGNOSIS — I10 Essential (primary) hypertension: Secondary | ICD-10-CM | POA: Diagnosis not present

## 2016-11-28 DIAGNOSIS — J069 Acute upper respiratory infection, unspecified: Secondary | ICD-10-CM | POA: Diagnosis not present

## 2016-11-28 MED ORDER — IPRATROPIUM BROMIDE 0.06 % NA SOLN
1.0000 | Freq: Four times a day (QID) | NASAL | 0 refills | Status: DC | PRN
Start: 2016-11-28 — End: 2017-07-31

## 2016-11-28 MED ORDER — BENZONATATE 200 MG PO CAPS: 200.0000 mg | ORAL_CAPSULE | Freq: Three times a day (TID) | ORAL | 0 refills | Status: DC | PRN

## 2016-11-28 NOTE — Progress Notes (Signed)
HPI:                                                                Russell Spencer is a 51 y.o. male who presents to [facility] today for a sick visit  Sinus Problem  This is a new problem. The current episode started yesterday. The problem is unchanged. There has been no fever. He is experiencing no pain. Associated symptoms include congestion and a sore throat. Pertinent negatives include no coughing, ear pain, headaches, shortness of breath or sinus pressure. (+ myalgias) Treatments tried: Mucinex. The treatment provided no relief.  Nonsmoker. No asthma/COPD/underlying lung disease.   Past Medical History:  Diagnosis Date  . Anxiety    No past surgical history on file. Social History  Substance Use Topics  . Smoking status: Never Smoker  . Smokeless tobacco: Never Used  . Alcohol use No   family history includes Diabetes in his mother; Hyperlipidemia in his mother; Hypertension in his mother; Kidney failure in his mother.  ROS: negative except as noted in the HPI  Medications: Current Outpatient Prescriptions  Medication Sig Dispense Refill  . ALPRAZolam (XANAX) 0.25 MG tablet TAKE 1 TABLET BY MOUTH AT BEDTIME AS NEEDED 30 tablet 0  . atorvastatin (LIPITOR) 20 MG tablet Take 1 tablet (20 mg total) by mouth at bedtime. TAKE 1 TABLET AT BEDTIME 90 tablet 3  . clobetasol cream (TEMOVATE) 0.05 % APPLY 1 APPLICATION TOPICALLY 2 (TWO) TIMES DAILY. 30 g 1  . lisinopril-hydrochlorothiazide (PRINZIDE,ZESTORETIC) 10-12.5 MG tablet Take 1 tablet by mouth daily. 90 tablet 0   No current facility-administered medications for this visit.    Allergies  Allergen Reactions  . Penicillins     REACTION: rash       Objective:  BP (!) 144/89   Pulse 88   Temp 98.2 F (36.8 C) (Oral)   Wt 157 lb (71.2 kg)   SpO2 100%   BMI 22.53 kg/m  Gen: well-groomed, cooperative, ill-appearing, not toxic-appearing, no distress HEENT: normal  conjunctiva, TM's clear, nasal mucosa edematous, oropharynx clear, moist mucus membranes, no frontal or maxillary sinus tenderness Pulm: Normal work of breathing, normal phonation, clear to auscultation bilaterally, no wheezes, rales or rhonchi CV: Normal rate, regular rhythm, s1 and s2 distinct, no murmurs, clicks or rubs  GI: soft, nondistended, nontender Neuro: alert and oriented x 3, EOM's intact Lymph: no cervical or tonsillar adenopathy Skin: warm and dry, no rashes or lesions on exposed skin, no cyanosis   No results found for this or any previous visit (from the past 72 hour(s)). No results found.    Assessment and Plan: 51 y.o. male with   1. Acute upper respiratory infection - symptomatic management with nasal spray, oral decongestant, tylenol and mucinex - work note provided - ipratropium (ATROVENT) 0.06 % nasal spray; Place 1 spray into both nostrils 4 (four) times daily as needed.  Dispense: 15 mL; Refill: 0 - benzonatate (TESSALON) 200 MG capsule; Take 1 capsule (200 mg total) by mouth 3 (three) times daily as needed for cough.  Dispense: 45 capsule; Refill: 0  2. Elevated blood pressure reading in office with diagnosis of hypertension - BP out of range today, but patient is acutely ill. No chest pain,  dyspnea, headache, lightheadedness or peripheral edema    Patient education and anticipatory guidance given Patient agrees with treatment plan Follow-up as needed if symptoms worsen or fail to improve  Levonne Hubertharley E. Katharyn Schauer PA-C

## 2016-11-28 NOTE — Patient Instructions (Addendum)
PRESCRTIPTION ATROVENT - nasal spray 4 times daily as directed on prescription bottle  SUDAFED PE (PHENYLEPHRINE) - 10 mg every 4 - 6 hours, maximum 60 mg per day  TYLENOL as needed for body aches and pains  MUCINEX (GUAIFENESIN) - every 4 hours. Take with at least 8 oz of water  A FEW NOTES ON OVER-THE-COUNTER COLD MEDICATIONS  USE CAUTION - many OTC medications come in combinations of multiple generic medicines. A clue is anything that has the name "cold" "flu" or "multi-symptom" in the name. ALWAYS READ THE LABELS to make sure you are not taking a cold medicine, which contains medications you are also taking separately or contains medications that you may be allergic to  Remember you can always ask the pharmacist for help in choosing over-the-counter medications  Please continue to take your prescription medications. If you are concerned about a drug interaction or side effect, please call our office  If your symptoms are getting worse despite treatment, please return. For after-hours care, there is someone on-call who can provide advice. For severe illness, go to the emergency room     Upper Respiratory Infection, Adult Most upper respiratory infections (URIs) are a viral infection of the air passages leading to the lungs. A URI affects the nose, throat, and upper air passages. The most common type of URI is nasopharyngitis and is typically referred to as "the common cold." URIs run their course and usually go away on their own. Most of the time, a URI does not require medical attention, but sometimes a bacterial infection in the upper airways can follow a viral infection. This is called a secondary infection. Sinus and middle ear infections are common types of secondary upper respiratory infections. Bacterial pneumonia can also complicate a URI. A URI can worsen asthma and chronic obstructive pulmonary disease (COPD). Sometimes, these complications can require emergency medical care and  may be life threatening. What are the causes? Almost all URIs are caused by viruses. A virus is a type of germ and can spread from one person to another. What increases the risk? You may be at risk for a URI if:  You smoke.  You have chronic heart or lung disease.  You have a weakened defense (immune) system.  You are very young or very old.  You have nasal allergies or asthma.  You work in crowded or poorly ventilated areas.  You work in health care facilities or schools. What are the signs or symptoms? Symptoms typically develop 2-3 days after you come in contact with a cold virus. Most viral URIs last 7-10 days. However, viral URIs from the influenza virus (flu virus) can last 14-18 days and are typically more severe. Symptoms may include:  Runny or stuffy (congested) nose.  Sneezing.  Cough.  Sore throat.  Headache.  Fatigue.  Fever.  Loss of appetite.  Pain in your forehead, behind your eyes, and over your cheekbones (sinus pain).  Muscle aches. How is this diagnosed? Your health care provider may diagnose a URI by:  Physical exam.  Tests to check that your symptoms are not due to another condition such as:  Strep throat.  Sinusitis.  Pneumonia.  Asthma. How is this treated? A URI goes away on its own with time. It cannot be cured with medicines, but medicines may be prescribed or recommended to relieve symptoms. Medicines may help:  Reduce your fever.  Reduce your cough.  Relieve nasal congestion. Follow these instructions at home:  Take medicines only as  directed by your health care provider.  Gargle warm saltwater or take cough drops to comfort your throat as directed by your health care provider.  Use a warm mist humidifier or inhale steam from a shower to increase air moisture. This may make it easier to breathe.  Drink enough fluid to keep your urine clear or pale yellow.  Eat soups and other clear broths and maintain good  nutrition.  Rest as needed.  Return to work when your temperature has returned to normal or as your health care provider advises. You may need to stay home longer to avoid infecting others. You can also use a face mask and careful hand washing to prevent spread of the virus.  Increase the usage of your inhaler if you have asthma.  Do not use any tobacco products, including cigarettes, chewing tobacco, or electronic cigarettes. If you need help quitting, ask your health care provider. How is this prevented? The best way to protect yourself from getting a cold is to practice good hygiene.  Avoid oral or hand contact with people with cold symptoms.  Wash your hands often if contact occurs. There is no clear evidence that vitamin C, vitamin E, echinacea, or exercise reduces the chance of developing a cold. However, it is always recommended to get plenty of rest, exercise, and practice good nutrition. Contact a health care provider if:  You are getting worse rather than better.  Your symptoms are not controlled by medicine.  You have chills.  You have worsening shortness of breath.  You have brown or red mucus.  You have yellow or brown nasal discharge.  You have pain in your face, especially when you bend forward.  You have a fever.  You have swollen neck glands.  You have pain while swallowing.  You have white areas in the back of your throat. Get help right away if:  You have severe or persistent:  Headache.  Ear pain.  Sinus pain.  Chest pain.  You have chronic lung disease and any of the following:  Wheezing.  Prolonged cough.  Coughing up blood.  A change in your usual mucus.  You have a stiff neck.  You have changes in your:  Vision.  Hearing.  Thinking.  Mood. This information is not intended to replace advice given to you by your health care provider. Make sure you discuss any questions you have with your health care provider. Document  Released: 04/12/2001 Document Revised: 06/19/2016 Document Reviewed: 01/22/2014 Elsevier Interactive Patient Education  2017 [employer]

## 2016-12-05 ENCOUNTER — Encounter: Payer: Self-pay | Admitting: Family Medicine

## 2016-12-05 ENCOUNTER — Ambulatory Visit (INDEPENDENT_AMBULATORY_CARE_PROVIDER_SITE_OTHER): Payer: BC Managed Care – PPO | Admitting: Family Medicine

## 2016-12-05 ENCOUNTER — Ambulatory Visit (INDEPENDENT_AMBULATORY_CARE_PROVIDER_SITE_OTHER): Payer: BC Managed Care – PPO

## 2016-12-05 VITALS — BP 129/79 | HR 89 | Temp 97.9°F | Wt 158.0 lb

## 2016-12-05 DIAGNOSIS — R0602 Shortness of breath: Secondary | ICD-10-CM | POA: Diagnosis not present

## 2016-12-05 DIAGNOSIS — R05 Cough: Secondary | ICD-10-CM

## 2016-12-05 MED ORDER — PREDNISONE 10 MG PO TABS: 30.0000 mg | ORAL_TABLET | Freq: Every day | ORAL | 0 refills | Status: DC

## 2016-12-05 MED ORDER — IPRATROPIUM-ALBUTEROL 0.5-2.5 (3) MG/3ML IN SOLN
3.0000 mL | Freq: Once | RESPIRATORY_TRACT | Status: AC
  Administered 2016-12-05: 3 mL via RESPIRATORY_TRACT

## 2016-12-05 MED ORDER — ALBUTEROL SULFATE HFA 108 (90 BASE) MCG/ACT IN AERS: 2.0000 | INHALATION_SPRAY | Freq: Four times a day (QID) | RESPIRATORY_TRACT | 0 refills | Status: DC | PRN

## 2016-12-05 NOTE — Patient Instructions (Signed)
Thank you for coming in today. Call or go to the emergency room if you get worse, have trouble breathing, have chest pains, or palpitations.  Use the albuterol.  Take prednisone.  Get xray today.    Acute Bronchitis, Adult Acute bronchitis is when air tubes (bronchi) in the lungs suddenly get swollen. The condition can make it hard to breathe. It can also cause these symptoms:  A cough.  Coughing up clear, yellow, or green mucus.  Wheezing.  Chest congestion.  Shortness of breath.  A fever.  Body aches.  Chills.  A sore throat. Follow these instructions at home: Medicines  Take over-the-counter and prescription medicines only as told by your doctor.  If you were prescribed an antibiotic medicine, take it as told by your doctor. Do not stop taking the antibiotic even if you start to feel better. General instructions  Rest.  Drink enough fluids to keep your pee (urine) clear or pale yellow.  Avoid smoking and secondhand smoke. If you smoke and you need help quitting, ask your doctor. Quitting will help your lungs heal faster.  Use an inhaler, cool mist vaporizer, or humidifier as told by your doctor.  Keep all follow-up visits as told by your doctor. This is important. How is this prevented? To lower your risk of getting this condition again:  Wash your hands often with soap and water. If you cannot use soap and water, use hand sanitizer.  Avoid contact with people who have cold symptoms.  Try not to touch your hands to your mouth, nose, or eyes.  Make sure to get the flu shot every year. Contact a doctor if:  Your symptoms do not get better in 2 weeks. Get help right away if:  You cough up blood.  You have chest pain.  You have very bad shortness of breath.  You become dehydrated.  You faint (pass out) or keep feeling like you are going to pass out.  You keep throwing up (vomiting).  You have a very bad headache.  Your fever or chills gets  worse. This information is not intended to replace advice given to you by your health care provider. Make sure you discuss any questions you have with your health care provider. Document Released: 04/04/2008 Document Revised: 05/25/2016 Document Reviewed: 04/06/2016 Elsevier Interactive Patient Education  2017 [employer]

## 2016-12-05 NOTE — Progress Notes (Signed)
Russell Spencer is a 51 y.o. male who presents to [facility] today for cough congestion nasal discharge and nasal congestion. This is associated with mild shortness of breath. Symptoms upper and present for about a week or so. Patient was seen in the 29th prescribed Atrovent nasal spray. He notes this hasn't helped much at all. He denies vomiting diarrhea or chest pains or palpitations. He is using occasional over-the-counter medication which also hasn't helped much. He denies any significant personal medical history for asthma.   Past Medical History:  Diagnosis Date  . Anxiety    No past surgical history on file. Social History  Substance Use Topics  . Smoking status: Never Smoker  . Smokeless tobacco: Never Used  . Alcohol use No   family history includes Diabetes in his mother; Hyperlipidemia in his mother; Hypertension in his mother; Kidney failure in his mother.  ROS as above:  Medications: Current Outpatient Prescriptions  Medication Sig Dispense Refill  . ALPRAZolam (XANAX) 0.25 MG tablet TAKE 1 TABLET BY MOUTH AT BEDTIME AS NEEDED 30 tablet 0  . atorvastatin (LIPITOR) 20 MG tablet Take 1 tablet (20 mg total) by mouth at bedtime. TAKE 1 TABLET AT BEDTIME 90 tablet 3  . benzonatate (TESSALON) 200 MG capsule Take 1 capsule (200 mg total) by mouth 3 (three) times daily as needed for cough. 45 capsule 0  . clobetasol cream (TEMOVATE) 0.05 % APPLY 1 APPLICATION TOPICALLY 2 (TWO) TIMES DAILY. 30 g 1  . ipratropium (ATROVENT) 0.06 % nasal spray Place 1 spray into both nostrils 4 (four) times daily as needed. 15 mL 0  . lisinopril-hydrochlorothiazide (PRINZIDE,ZESTORETIC) 10-12.5 MG tablet Take 1 tablet by mouth daily. 90 tablet 0  . albuterol (PROVENTIL HFA;VENTOLIN HFA) 108 (90 Base) MCG/ACT inhaler Inhale 2 puffs into the lungs every 6 (six) hours as needed for  wheezing or shortness of breath. 1 Inhaler 0  . predniSONE (DELTASONE) 10 MG tablet Take 3 tablets (30 mg total) by mouth daily with breakfast. 15 tablet 0   No current facility-administered medications for this visit.    Allergies  Allergen Reactions  . Penicillins     REACTION: rash    Health Maintenance Health Maintenance  Topic Date Due  . COLONOSCOPY  08/12/2016  . INFLUENZA VACCINE  01/29/2019 (Originally 05/31/2016)  . TETANUS/TDAP  11/08/2021  . HIV Screening  Completed     Exam:  BP 129/79   Pulse 89   Temp 97.9 F (36.6 C) (Oral)   Wt 158 lb (71.7 kg)   SpO2 100%   BMI 22.67 kg/m  Gen: Well NAD HEENT: EOMI,  MMM yellow nasal discharge and nasal congestion and inflamed nasal turbinates are present bilaterally. Nontender maxillary and frontal sinuses. Mild cervical lymphadenopathy present bilaterally. Lungs: Normal work of breathing. Coarse breath sounds and prolonged expiratory phase present bilaterally. Heart: RRR no MRG Abd: NABS, Soft. Nondistended, Nontender Exts: Brisk capillary refill, warm and well perfused.   He was given a 2.5/0.5 mg DuoNeb nebulizer treatment and felt subjectively improved.   No results found for this or any previous visit (from the past 72 hour(s)). No results found.    Assessment and Plan: 51 y.o. male with  Viral bronchitis. Plan to treat with prednisone and albuterol as patient improved with DuoNeb in the office today. Additionally we'll obtain a chest x-ray to assess for pneumonia. Plan to recheck and return to clinic if not improving. Additionally recommend  Tylenol as well as other over-the-counter medications as needed for symptom control. Work note provided.   Orders Placed This Encounter  Procedures  . DG Chest 2 View    Order Specific Question:   Reason for exam:    Answer:   Cough, assess intra-thoracic pathology    Order Specific Question:   Preferred imaging location?    Answer:   [facility]     Discussed warning signs or symptoms. Please see discharge instructions. Patient expresses understanding.

## 2016-12-12 ENCOUNTER — Telehealth: Payer: Self-pay | Admitting: Family Medicine

## 2016-12-12 NOTE — Telephone Encounter (Signed)
Pt states he has been seen clinic twice for sinus issues and he still has a dry cough and some nasal congestion. Questions if he should get an antibiotic at this point. Will route to PCP for review.

## 2016-12-13 MED ORDER — AZITHROMYCIN 250 MG PO TABS: ORAL_TABLET | ORAL | 0 refills | Status: AC

## 2016-12-13 NOTE — Telephone Encounter (Signed)
Reviewed notes from my partners. Reasonable to treat for sinusitis. Prescription sent for azithromycin to the pharmacy. [facility] [place] [address].  Nani Gasseratherine Burleigh Brockmann, MD

## 2016-12-13 NOTE — Telephone Encounter (Signed)
Pt advised.

## 2017-01-18 ENCOUNTER — Other Ambulatory Visit: Payer: Self-pay | Admitting: Family Medicine

## 2017-03-20 ENCOUNTER — Encounter: Payer: BC Managed Care – PPO | Admitting: Family Medicine

## 2017-04-12 ENCOUNTER — Other Ambulatory Visit: Payer: Self-pay | Admitting: Family Medicine

## 2017-07-10 ENCOUNTER — Other Ambulatory Visit: Payer: Self-pay | Admitting: Family Medicine

## 2017-07-18 ENCOUNTER — Telehealth: Payer: Self-pay

## 2017-07-18 NOTE — Telephone Encounter (Signed)
Pt was admitted to [facility] Saturday with SVT.  They prescribed him Metoprolol.  He wants to verify that it is ok to take with his other medications.  Please advise.

## 2017-07-18 NOTE — Telephone Encounter (Signed)
I hope he is doing okay. Please see if we also need to schedule him for a hospital follow-up. Yes it is okay to take the metoprolol.

## 2017-07-18 NOTE — Telephone Encounter (Signed)
Notified patient, transferred to scheduling for follow up appointment.

## 2017-07-31 ENCOUNTER — Ambulatory Visit (INDEPENDENT_AMBULATORY_CARE_PROVIDER_SITE_OTHER): Payer: BC Managed Care – PPO | Admitting: Family Medicine

## 2017-07-31 ENCOUNTER — Encounter: Payer: Self-pay | Admitting: Family Medicine

## 2017-07-31 VITALS — BP 120/74 | HR 77 | Ht 70.0 in | Wt 157.0 lb

## 2017-07-31 DIAGNOSIS — I471 Supraventricular tachycardia, unspecified: Secondary | ICD-10-CM

## 2017-07-31 DIAGNOSIS — Z1211 Encounter for screening for malignant neoplasm of colon: Secondary | ICD-10-CM

## 2017-07-31 DIAGNOSIS — E7800 Pure hypercholesterolemia, unspecified: Secondary | ICD-10-CM

## 2017-07-31 DIAGNOSIS — I10 Essential (primary) hypertension: Secondary | ICD-10-CM | POA: Diagnosis not present

## 2017-07-31 NOTE — Progress Notes (Signed)
Subjective:    Patient ID: Russell Spencer, male    DOB: 1966/05/05, 51 y.o.   MRN: [id number]  HPI  51 year old male follows up today for recent hospitalization about 2 weeks ago for superventricular tachycardia. He was admitted on September 15. He is a 51 year old male with a history of hypertension who had a history significant for SVT back in 2010. He was playing basketball when he suddenly felt palpitations and called EMS. When they arrived he was in SVT with a rate of 210 bpm. Given edema since 6 mg followed by dizziness and 10 mg IV. He then converted to atrial fibrillation with RVR. He was then given Lopressor 5 mg and eventually converted to sinus rhythm later that evening. He remained hemodynamically stable. He was discharged home on Toprol 50 mg without into an any anticoagulation as his CHADs VASC score was 1 review was also noted to have low magnesium while there.  Has felt the metoprolol has made him a little tired.    Hyperlipidemia-doing well on current statin. No side effects or myalgias.  Hypertension- Pt denies chest pain, SOB, dizziness, or heart palpitations.  Taking meds as directed w/o problems.  Denies medication side effects.      Review of Systems  BP 120/74   Pulse 77   Ht  (1.778 m)   Wt 157 lb (71.2 kg)   SpO2 100%   BMI 22.53 kg/m     Allergies  Allergen Reactions  . Penicillins     REACTION: rash    Past Medical History:  Diagnosis Date  . Anxiety     No past surgical history on file.  Social History   Social History  . Marital status: Married    Spouse name: N/A  . Number of children: 3  . Years of education: N/A   Occupational History  . Geographical information systems officer      [employer]    Social History Main Topics  . Smoking status: Never Smoker  . Smokeless tobacco: Never Used  . Alcohol use No  . Drug use: No  . Sexual activity: Yes    Partners: Female   Other Topics Concern  . Not on file   Social History Narrative   Some exercise,  5 x a week.      Family History  Problem Relation Age of Onset  . Diabetes Mother   . Hypertension Mother   . Hyperlipidemia Mother   . Kidney failure Mother        Dialysis    Outpatient Encounter Prescriptions as of 07/31/2017  Medication Sig  . atorvastatin (LIPITOR) 20 MG tablet TAKE 1 TABLET (20 MG TOTAL) BY MOUTH AT BEDTIME. TAKE 1 TABLET AT BEDTIME  . clobetasol cream (TEMOVATE) 0.05 % APPLY 1 APPLICATION TOPICALLY 2 (TWO) TIMES DAILY.  Marland Kitchen lisinopril-hydrochlorothiazide (PRINZIDE,ZESTORETIC) 10-12.5 MG tablet Take 1 tablet by mouth daily. Due for follow up visit  . metoprolol succinate (TOPROL-XL) 50 MG 24 hr tablet Take 50 mg by mouth daily.  . [DISCONTINUED] albuterol (PROVENTIL HFA;VENTOLIN HFA) 108 (90 Base) MCG/ACT inhaler Inhale 2 puffs into the lungs every 6 (six) hours as needed for wheezing or shortness of breath.  . [DISCONTINUED] ALPRAZolam (XANAX) 0.25 MG tablet TAKE 1 TABLET BY MOUTH AT BEDTIME AS NEEDED  . [DISCONTINUED] ipratropium (ATROVENT) 0.06 % nasal spray Place 1 spray into both nostrils 4 (four) times daily as needed.  . [DISCONTINUED] predniSONE (DELTASONE) 10 MG tablet Take 3 tablets (30 mg total) by  mouth daily with breakfast.   No facility-administered encounter medications on file as of 07/31/2017.          Objective:   Physical Exam  Constitutional: He is oriented to person, place, and time. He appears well-developed and well-nourished.  HENT:  Head: Normocephalic and atraumatic.  Cardiovascular: Normal rate, regular rhythm and normal heart sounds.   Pulmonary/Chest: Effort normal and breath sounds normal.  Neurological: He is alert and oriented to person, place, and time.  Skin: Skin is warm and dry.  Psychiatric: He has a normal mood and affect. His behavior is normal.          Assessment & Plan:  Superventricular tachycardia-we'll need to follow up with EP cardiologist in about 2 months. We'll try to get him scheduled here  locally.Continue with metoprolol until he sees cardiology. They will likely want him on this for quite some time. But certainly they can make her a recommendation on that. I will be happy to refill medications when he is due. He says right now he is okay on his prescription.  Hyperlipidemia-continue with statin. Due for repeat labs in November.  Colon Ca screening - recommend yearly stool cards.   Hypertension-blood pressure looks fantastic. Even with the addition of the metoprolol don't think work on a have to adjust his lisinopril. We'll continue with both medications for now and monitor for any low blood pressures.

## 2017-08-13 ENCOUNTER — Other Ambulatory Visit: Payer: Self-pay | Admitting: Family Medicine

## 2017-11-10 ENCOUNTER — Other Ambulatory Visit: Payer: Self-pay | Admitting: *Deleted

## 2017-11-10 DIAGNOSIS — I10 Essential (primary) hypertension: Secondary | ICD-10-CM

## 2017-11-10 MED ORDER — LISINOPRIL-HYDROCHLOROTHIAZIDE 10-12.5 MG PO TABS: 1.0000 | ORAL_TABLET | Freq: Every day | ORAL | 0 refills | Status: DC

## 2017-12-28 ENCOUNTER — Ambulatory Visit: Payer: BC Managed Care – PPO | Admitting: Family Medicine

## 2017-12-28 ENCOUNTER — Encounter: Payer: Self-pay | Admitting: Family Medicine

## 2017-12-28 VITALS — BP 116/71 | HR 65 | Temp 97.9°F | Ht 70.0 in | Wt 158.0 lb

## 2017-12-28 DIAGNOSIS — R05 Cough: Secondary | ICD-10-CM | POA: Diagnosis not present

## 2017-12-28 DIAGNOSIS — Z1211 Encounter for screening for malignant neoplasm of colon: Secondary | ICD-10-CM

## 2017-12-28 DIAGNOSIS — I480 Paroxysmal atrial fibrillation: Secondary | ICD-10-CM

## 2017-12-28 DIAGNOSIS — I4891 Unspecified atrial fibrillation: Secondary | ICD-10-CM

## 2017-12-28 DIAGNOSIS — R059 Cough, unspecified: Secondary | ICD-10-CM

## 2017-12-28 DIAGNOSIS — R0982 Postnasal drip: Secondary | ICD-10-CM

## 2017-12-28 DIAGNOSIS — K219 Gastro-esophageal reflux disease without esophagitis: Secondary | ICD-10-CM | POA: Diagnosis not present

## 2017-12-28 HISTORY — DX: Unspecified atrial fibrillation: I48.91

## 2017-12-28 MED ORDER — IPRATROPIUM BROMIDE 0.06 % NA SOLN: 2.0000 | NASAL | 6 refills | Status: DC | PRN

## 2017-12-28 MED ORDER — BENZONATATE 200 MG PO CAPS: 200.0000 mg | ORAL_CAPSULE | Freq: Three times a day (TID) | ORAL | 0 refills | Status: DC | PRN

## 2017-12-28 NOTE — Patient Instructions (Addendum)
Thank you for coming in today. Send that cologaurd test back in .  I will ask Dr Linford ArnoldMetheney about cardiology.  Take the atrovent nasal spray for post nasal drainage.  Use the tessalon pearles for cough.  Avoid medicine that contain pseudophed.  Recheck with Dr Eppie GibsonMetheny for Well Exam physical in a month or so.   Consider taking over the counter prilosec (omeprazole) daily for possible acid reflux.   Return sooner if needed.    Postnasal Drip Postnasal drip is the feeling of mucus going down the back of your throat. Mucus is a slimy substance that moistens and cleans your nose and throat, as well as the air pockets in face bones near your forehead and cheeks (sinuses). Small amounts of mucus pass from your nose and sinuses down the back of your throat all the time. This is normal. When you produce too much mucus or the mucus gets too thick, you can feel it. Some common causes of postnasal drip include:  Having more mucus because of: ? A cold or the flu. ? Allergies. ? Cold air. ? Certain medicines.  Having more mucus that is thicker because of: ? A sinus or nasal infection. ? Dry air. ? A food allergy.  Follow these instructions at home: Relieving discomfort  Gargle with a salt-water mixture 3-4 times a day or as needed. To make a salt-water mixture, completely dissolve -1 tsp of salt in 1 cup of warm water.  If the air in your home is dry, use a humidifier to add moisture to the air.  Use a saline spray or container (neti pot) to flush out the nose (nasal irrigation). These methods can help clear away mucus and keep the nasal passages moist. General instructions  Take over-the-counter and prescription medicines only as told by your health care provider.  Follow instructions from your health care provider about eating or drinking restrictions. You may need to avoid caffeine.  Avoid things that you know you are allergic to (allergens), like dust, mold, pollen, pets, or certain  foods.  Drink enough fluid to keep your urine pale yellow.  Keep all follow-up visits as told by your health care provider. This is important. Contact a health care provider if:  You have a fever.  You have a sore throat.  You have difficulty swallowing.  You have headache.  You have sinus pain.  You have a cough that does not go away.  The mucus from your nose becomes thick and is green or yellow in color.  You have cold or flu symptoms that last more than 10 days. Summary  Postnasal drip is the feeling of mucus going down the back of your throat.  If your health care provider approves, use nasal irrigation or a nasal spray 2?4 times a day.  Avoid things that you know you are allergic to (allergens), like dust, mold, pollen, pets, or certain foods. This information is not intended to replace advice given to you by your health care provider. Make sure you discuss any questions you have with your health care provider. Document Released: 01/30/2017 Document Revised: 01/30/2017 Document Reviewed: 01/30/2017 Elsevier Interactive Patient Education  [employer]2018 [employer]

## 2017-12-28 NOTE — Progress Notes (Signed)
Russell Spencer is a 52 y.o. male who presents to [facility] today for throat irritation.  Russell Spencer has a several day history of runny nose congestion cough and a mild throat irritation.  He feels as though sometimes there is something stuck in his throat.  He denies any trouble swallowing or gagging or any trouble breathing.  He notes the cough is nonproductive and annoying especially at bedtime.  He is tried over-the-counter Mucinex which is helped only a little.  He notes a pertinent medical history for acid reflux but denies any other acid reflux symptoms such as burning throat or sour taste in his mouth or an ongoing chronic nighttime cough.  He does not currently take any medications for acid reflux.  Additionally Russell Spencer notes a pertinent medical history for SVT converted to paroxysmal atrial fibrillation.  This required hospitalization in the fall 2018.  He notes he has had 2 or 3 other episodes of SVT in his life requiring hospitalization and was sounds like chemical cardioversion.  His last follow-up appointment for this was in October 2018.  At that appointment there was a plan to follow-up with electrophysiology.  He notes he never made a follow-up appointment.  He feels fine and is completely asymptomatic with metoprolol.  He is somewhat worried that his SVT will occur while he is on vacation in [place] for example.  Additionally he had a Cologuard ordered as part of his colon cancer screening.  He has a Cologuard kit at home but has never sent it back.  He notes that he wants to have colon cancer screening and is willing to send the kit back.    Past Medical History:  Diagnosis Date  . A-fib ([place]) 12/28/2017  . Anxiety   . Paroxysmal SVT (supraventricular tachycardia) ([place]) 02/04/2010   Qualifier: Diagnosis of  By: Madilyn Fireman MD, Barnetta Chapel     No past surgical history on  file. Social History   Tobacco Use  . Smoking status: Never Smoker  . Smokeless tobacco: Never Used  Substance Use Topics  . Alcohol use: No   family history includes Diabetes in his mother; Hyperlipidemia in his mother; Hypertension in his mother; Kidney failure in his mother.  ROS as above:  Medications: Current Outpatient Medications  Medication Sig Dispense Refill  . atorvastatin (LIPITOR) 20 MG tablet TAKE 1 TABLET (20 MG TOTAL) BY MOUTH AT BEDTIME. TAKE 1 TABLET AT BEDTIME 90 tablet 2  . clobetasol cream (TEMOVATE) 6.77 % APPLY 1 APPLICATION TOPICALLY 2 (TWO) TIMES DAILY. 30 g 1  . lisinopril-hydrochlorothiazide (PRINZIDE,ZESTORETIC) 10-12.5 MG tablet Take 1 tablet by mouth daily. 90 tablet 0  . metoprolol succinate (TOPROL-XL) 50 MG 24 hr tablet Take 50 mg by mouth daily.    . benzonatate (TESSALON) 200 MG capsule Take 1 capsule (200 mg total) by mouth 3 (three) times daily as needed for cough. 45 capsule 0  . ipratropium (ATROVENT) 0.06 % nasal spray Place 2 sprays into both nostrils every 4 (four) hours as needed for rhinitis. 10 mL 6   No current facility-administered medications for this visit.    Allergies  Allergen Reactions  . Penicillins     REACTION: rash    Health Maintenance Health Maintenance  Topic Date Due  . Fecal DNA (Cologuard)  08/12/2016  . INFLUENZA VACCINE  08/30/2018 (Originally 05/31/2017)  . TETANUS/TDAP  11/08/2021  . HIV Screening  Completed     Exam:  BP 116/71  Pulse 65   Temp 97.9 F (36.6 C) (Oral)   Ht '5\' 10"'$  (1.778 m)   Wt 158 lb (71.7 kg)   BMI 22.67 kg/m  Gen: Well NAD HEENT: EOMI,  MMM clear nasal discharge.  Inflamed nasal turbinates bilaterally.  Posterior pharynx with cobblestoning.  Normal tympanic membranes bilaterally.  No significant cervical lymphadenopathy. Lungs: Normal work of breathing. CTABL Heart: RRR no MRG Abd: NABS, Soft. Nondistended, Nontender Exts: Brisk capillary refill, warm and well perfused.     No results found for this or any previous visit (from the past 72 hour(s)). No results found.    Assessment and Plan: 52 y.o. male with postnasal drainage with throat clearing and rhinorrhea.  This is very likely a viral URI.  Will treat symptomatically with over-the-counter medications including Tylenol Mucinex.  Additionally will prescribe Atrovent nasal spray and Tessalon Perles.  Cautioned patient and warned against using medicines that contain Sudafed as this may exacerbate SVT or atrial fibrillation.  Cardiac: Thyroid has a history of SVT requiring interventions 3 times in his adult life.  He was somewhat lost to follow-up over the last several months.  I discussed situation with Russell Spencer.  He will plan on following up with his primary care provider in about a month for a wellness visit.  In the meantime I will ask Dr. Madilyn Fireman if she would like me to place a referral back to electrophysiology before his follow up.   Colon cancer screening: We discussed the Cologuard.  Russell Spencer will send the kit back and complete colon cancer screening.  Influenza vaccine declined today.   No orders of the defined types were placed in this encounter.  Meds ordered this encounter  Medications  . ipratropium (ATROVENT) 0.06 % nasal spray    Sig: Place 2 sprays into both nostrils every 4 (four) hours as needed for rhinitis.    Dispense:  10 mL    Refill:  6  . benzonatate (TESSALON) 200 MG capsule    Sig: Take 1 capsule (200 mg total) by mouth 3 (three) times daily as needed for cough.    Dispense:  45 capsule    Refill:  0     Discussed warning signs or symptoms. Please see discharge instructions. Patient expresses understanding.

## 2018-02-11 ENCOUNTER — Other Ambulatory Visit: Payer: Self-pay | Admitting: Family Medicine

## 2018-02-11 DIAGNOSIS — I10 Essential (primary) hypertension: Secondary | ICD-10-CM

## 2018-02-26 ENCOUNTER — Other Ambulatory Visit: Payer: Self-pay | Admitting: Family Medicine

## 2018-03-01 ENCOUNTER — Other Ambulatory Visit: Payer: Self-pay

## 2018-03-01 MED ORDER — ATORVASTATIN CALCIUM 20 MG PO TABS: 20.0000 mg | ORAL_TABLET | Freq: Every day | ORAL | 0 refills | Status: DC

## 2018-03-01 NOTE — Telephone Encounter (Signed)
Pt called and schedule a his CPE with Dr Linford Arnold, but needs some Lipitor.   Will send in enough Lipitor to last pt until his CPE on 03-12-18

## 2018-03-12 ENCOUNTER — Encounter: Payer: Self-pay | Admitting: Family Medicine

## 2018-03-12 ENCOUNTER — Ambulatory Visit (INDEPENDENT_AMBULATORY_CARE_PROVIDER_SITE_OTHER): Payer: BC Managed Care – PPO | Admitting: Family Medicine

## 2018-03-12 VITALS — BP 113/70 | HR 65 | Ht 70.0 in | Wt 157.0 lb

## 2018-03-12 DIAGNOSIS — Z0000 Encounter for general adult medical examination without abnormal findings: Secondary | ICD-10-CM | POA: Diagnosis not present

## 2018-03-12 DIAGNOSIS — I10 Essential (primary) hypertension: Secondary | ICD-10-CM

## 2018-03-12 MED ORDER — LISINOPRIL-HYDROCHLOROTHIAZIDE 10-12.5 MG PO TABS: 1.0000 | ORAL_TABLET | Freq: Every day | ORAL | 1 refills | Status: DC

## 2018-03-12 MED ORDER — ATORVASTATIN CALCIUM 20 MG PO TABS: 20.0000 mg | ORAL_TABLET | Freq: Every day | ORAL | 3 refills | Status: DC

## 2018-03-12 MED ORDER — ALPRAZOLAM 0.25 MG PO TABS: 0.2500 mg | ORAL_TABLET | Freq: Every day | ORAL | 0 refills | Status: DC | PRN

## 2018-03-12 NOTE — Patient Instructions (Signed)
These remember to complete your Cologuard kit and send it back. Okay try to try Benadryl 25 mg at bedtime as needed for sleep.

## 2018-03-12 NOTE — Progress Notes (Signed)
Subjective:    Patient ID: Russell Spencer, male    DOB: 10-Jun-1966, 52 y.o.   MRN: [id number]  HPI 52 year old male is here today for complete physical exam.  He is doing well overall.  Very happy with his job.  He did receive the Cologuard kit which is has not completed it yet.  He and his wife will be traveling to [place] soon and would like a refill on his Xanax.  Last for couple years ago.     Review of Systems   Comprehensive  of review of systems is negative except for HPI.  BP 113/70   Pulse 65   Ht '5\' 10"'$  (1.778 m)   Wt 157 lb (71.2 kg)   SpO2 100%   BMI 22.53 kg/m     Allergies  Allergen Reactions  . Penicillins     REACTION: rash    Past Medical History:  Diagnosis Date  . A-fib ([place]) 12/28/2017  . Anxiety   . Paroxysmal SVT (supraventricular tachycardia) ([place]) 02/04/2010   Qualifier: Diagnosis of  By: Madilyn Fireman MD, Barnetta Chapel      No past surgical history on file.  Social History   Socioeconomic History  . Marital status: Married    Spouse name: Not on file  . Number of children: 3  . Years of education: Not on file  . Highest education level: Not on file  Occupational History  . Occupation: Retail banker     Comment: [employer]   Social Needs  . Financial resource strain: Not on file  . Food insecurity:    Worry: Not on file    Inability: Not on file  . Transportation needs:    Medical: Not on file    Non-medical: Not on file  Tobacco Use  . Smoking status: Never Smoker  . Smokeless tobacco: Never Used  Substance and Sexual Activity  . Alcohol use: No  . Drug use: No  . Sexual activity: Yes    Partners: Female  Lifestyle  . Physical activity:    Days per week: Not on file    Minutes per session: Not on file  . Stress: Not on file  Relationships  . Social connections:    Talks on phone: Not on file    Gets together: Not on file    Attends religious service: Not on file    Active member of club or organization: Not on file    Attends  meetings of clubs or organizations: Not on file    Relationship status: Not on file  . Intimate partner violence:    Fear of current or ex partner: Not on file    Emotionally abused: Not on file    Physically abused: Not on file    Forced sexual activity: Not on file  Other Topics Concern  . Not on file  Social History Narrative   Some exercise, 5 x a week.      Family History  Problem Relation Age of Onset  . Diabetes Mother   . Hypertension Mother   . Hyperlipidemia Mother   . Kidney failure Mother        Dialysis    Outpatient Encounter Medications as of 03/12/2018  Medication Sig  . ALPRAZolam (XANAX) 0.25 MG tablet Take 1 tablet (0.25 mg total) by mouth daily as needed.  Marland Kitchen atorvastatin (LIPITOR) 20 MG tablet Take 1 tablet (20 mg total) by mouth at bedtime. TAKE 1 TABLET AT BEDTIME  . clobetasol cream (TEMOVATE) 0.05 %  APPLY 1 APPLICATION TOPICALLY 2 (TWO) TIMES DAILY.  Marland Kitchen lisinopril-hydrochlorothiazide (PRINZIDE,ZESTORETIC) 10-12.5 MG tablet Take 1 tablet by mouth daily.  . metoprolol succinate (TOPROL-XL) 50 MG 24 hr tablet Take 50 mg by mouth daily.  . [DISCONTINUED] atorvastatin (LIPITOR) 20 MG tablet Take 1 tablet (20 mg total) by mouth at bedtime. TAKE 1 TABLET AT BEDTIME  . [DISCONTINUED] benzonatate (TESSALON) 200 MG capsule Take 1 capsule (200 mg total) by mouth 3 (three) times daily as needed for cough.  . [DISCONTINUED] ipratropium (ATROVENT) 0.06 % nasal spray Place 2 sprays into both nostrils every 4 (four) hours as needed for rhinitis.  . [DISCONTINUED] lisinopril-hydrochlorothiazide (PRINZIDE,ZESTORETIC) 10-12.5 MG tablet Take 1 tablet by mouth daily. LAST REFILLPLEASE CALL OFFICE TO SCHEDULE APPOINTMENT   No facility-administered encounter medications on file as of 03/12/2018.         Objective:   Physical Exam  Constitutional: He is oriented to person, place, and time. He appears well-developed and well-nourished.  HENT:  Head: Normocephalic and  atraumatic.  Right Ear: External ear normal.  Left Ear: External ear normal.  Nose: Nose normal.  Mouth/Throat: Oropharynx is clear and moist.  Eyes: Pupils are equal, round, and reactive to light. Conjunctivae and EOM are normal.  Neck: Normal range of motion. Neck supple. No thyromegaly present.  Cardiovascular: Normal rate, regular rhythm, normal heart sounds and intact distal pulses.  Pulmonary/Chest: Effort normal and breath sounds normal.  Abdominal: Soft. Bowel sounds are normal. He exhibits no distension and no mass. There is no tenderness. There is no rebound and no guarding.  Musculoskeletal: Normal range of motion.  Lymphadenopathy:    He has no cervical adenopathy.  Neurological: He is alert and oriented to person, place, and time. He has normal reflexes.  Skin: Skin is warm and dry.  Psychiatric: He has a normal mood and affect. His behavior is normal. Judgment and thought content normal.        Assessment & Plan:  CPE: Keep up a regular exercise program and make sure you are eating a healthy diet Try to eat 4 servings of dairy a day, or if you are lactose intolerant take a calcium with vitamin D daily.  Your vaccines are up to date.  Labs.  Lab slip provided. Declined Tdap today. Remind him to complete Cologuard. Due for repeat PSA.

## 2018-04-14 LAB — COMPLETE METABOLIC PANEL WITH GFR
AG RATIO: 2 (calc) (ref 1.0–2.5)
ALT: 28 U/L (ref 9–46)
AST: 18 U/L (ref 10–35)
Albumin: 4.7 g/dL (ref 3.6–5.1)
Alkaline phosphatase (APISO): 64 U/L (ref 40–115)
BILIRUBIN TOTAL: 0.9 mg/dL (ref 0.2–1.2)
BUN: 18 mg/dL (ref 7–25)
CHLORIDE: 101 mmol/L (ref 98–110)
CO2: 30 mmol/L (ref 20–32)
Calcium: 10 mg/dL (ref 8.6–10.3)
Creat: 1.14 mg/dL (ref 0.70–1.33)
GFR, Est African American: 86 mL/min/{1.73_m2} (ref >=60)
GFR, Est Non African American: 74 mL/min/{1.73_m2} (ref >=60)
GLUCOSE: 96 mg/dL (ref 65–99)
Globulin: 2.4 g/dL (calc) (ref 1.9–3.7)
POTASSIUM: 4 mmol/L (ref 3.5–5.3)
Sodium: 138 mmol/L (ref 135–146)
Total Protein: 7.1 g/dL (ref 6.1–8.1)

## 2018-04-14 LAB — CBC
HEMATOCRIT: 41.5 % (ref 38.5–50.0)
HEMOGLOBIN: 13.8 g/dL (ref 13.2–17.1)
MCH: 30.1 pg (ref 27.0–33.0)
MCHC: 33.3 g/dL (ref 32.0–36.0)
MCV: 90.4 fL (ref 80.0–100.0)
MPV: 10.3 fL (ref 7.5–12.5)
Platelets: 258 10*3/uL (ref 140–400)
RBC: 4.59 10*6/uL (ref 4.20–5.80)
RDW: 11.6 % (ref 11.0–15.0)
WBC: 4 10*3/uL (ref 3.8–10.8)

## 2018-04-14 LAB — LIPID PANEL
CHOLESTEROL: 204 mg/dL — AB (ref <200)
HDL: 47 mg/dL (ref >40)
LDL Cholesterol (Calc): 136 mg/dL (calc) — ABNORMAL HIGH
Non-HDL Cholesterol (Calc): 157 mg/dL (calc) — ABNORMAL HIGH (ref <130)
TRIGLYCERIDES: 107 mg/dL (ref <150)
Total CHOL/HDL Ratio: 4.3 (calc) (ref <5.0)

## 2018-04-14 LAB — HEMOGLOBIN A1C
EAG (MMOL/L): 5.2 (calc)
HEMOGLOBIN A1C: 4.9 %{Hb} (ref <5.7)
MEAN PLASMA GLUCOSE: 94 (calc)

## 2018-04-14 LAB — PSA: PSA: 2 ng/mL (ref <=4.0)

## 2019-01-23 ENCOUNTER — Other Ambulatory Visit: Payer: Self-pay | Admitting: Family Medicine

## 2019-01-23 DIAGNOSIS — I10 Essential (primary) hypertension: Secondary | ICD-10-CM

## 2019-01-30 ENCOUNTER — Other Ambulatory Visit: Payer: Self-pay | Admitting: Family Medicine

## 2019-01-30 DIAGNOSIS — I10 Essential (primary) hypertension: Secondary | ICD-10-CM

## 2019-02-01 ENCOUNTER — Other Ambulatory Visit: Payer: Self-pay | Admitting: Family Medicine

## 2019-02-01 DIAGNOSIS — I10 Essential (primary) hypertension: Secondary | ICD-10-CM

## 2019-03-02 ENCOUNTER — Other Ambulatory Visit: Payer: Self-pay | Admitting: Family Medicine

## 2019-03-02 DIAGNOSIS — I10 Essential (primary) hypertension: Secondary | ICD-10-CM

## 2019-03-13 ENCOUNTER — Encounter: Payer: BC Managed Care – PPO | Admitting: Family Medicine

## 2019-03-31 ENCOUNTER — Other Ambulatory Visit: Payer: Self-pay | Admitting: Family Medicine

## 2019-03-31 DIAGNOSIS — I10 Essential (primary) hypertension: Secondary | ICD-10-CM

## 2019-04-15 ENCOUNTER — Other Ambulatory Visit: Payer: Self-pay

## 2019-04-15 ENCOUNTER — Encounter: Payer: Self-pay | Admitting: Sports Medicine

## 2019-04-15 ENCOUNTER — Ambulatory Visit (INDEPENDENT_AMBULATORY_CARE_PROVIDER_SITE_OTHER): Payer: 59

## 2019-04-15 ENCOUNTER — Ambulatory Visit (INDEPENDENT_AMBULATORY_CARE_PROVIDER_SITE_OTHER): Payer: 59 | Admitting: Sports Medicine

## 2019-04-15 DIAGNOSIS — M5412 Radiculopathy, cervical region: Secondary | ICD-10-CM | POA: Diagnosis not present

## 2019-04-15 MED ORDER — PREDNISONE 50 MG PO TABS: ORAL_TABLET | ORAL | 0 refills | Status: DC

## 2019-04-15 NOTE — Assessment & Plan Note (Signed)
Positive Spurling maneuver. Differential still includes carpal tunnel syndrome. 5 days of prednisone, cervical spine x-rays, formal PT. Return to see me in 4 weeks, MRI if no better.

## 2019-04-15 NOTE — Progress Notes (Signed)
Subjective:    CC: Right hand pain  HPI: Russell Spencer is a very pleasant 53 year old male, for the past week he is in pain in his right hand, worse with extension of his shoulder and elbow.  No progressive weakness, he describes sensation as burning.  Moderate, persistent, localized without radiation.  No trauma, no change in activities.  I reviewed the past medical history, family history, social history, surgical history, and allergies today and no changes were needed.  Please see the problem list section below in epic for further details.  Past Medical History: Past Medical History:  Diagnosis Date  . A-fib ([place]) 12/28/2017  . Anxiety   . Paroxysmal SVT (supraventricular tachycardia) ([place]) 02/04/2010   Qualifier: Diagnosis of  By: Madilyn Fireman MD, Barnetta Chapel     Past Surgical History: No past surgical history on file. Social History: Social History   Socioeconomic History  . Marital status: Married    Spouse name: Not on file  . Number of children: 3  . Years of education: Not on file  . Highest education level: Not on file  Occupational History  . Occupation: Retail banker     Comment: [employer]   Social Needs  . Financial resource strain: Not on file  . Food insecurity    Worry: Not on file    Inability: Not on file  . Transportation needs    Medical: Not on file    Non-medical: Not on file  Tobacco Use  . Smoking status: Never Smoker  . Smokeless tobacco: Never Used  Substance and Sexual Activity  . Alcohol use: No  . Drug use: No  . Sexual activity: Yes    Partners: Female  Lifestyle  . Physical activity    Days per week: Not on file    Minutes per session: Not on file  . Stress: Not on file  Relationships  . Social Herbalist on phone: Not on file    Gets together: Not on file    Attends religious service: Not on file    Active member of club or organization: Not on file    Attends meetings of clubs or organizations: Not on file    Relationship status:  Not on file  Other Topics Concern  . Not on file  Social History Narrative   Some exercise, 5 x a week.     Family History: Family History  Problem Relation Age of Onset  . Diabetes Mother   . Hypertension Mother   . Hyperlipidemia Mother   . Kidney failure Mother        Dialysis   Allergies: Allergies  Allergen Reactions  . Penicillins     REACTION: rash   Medications: See med rec.  Review of Systems: No fevers, chills, night sweats, weight loss, chest pain, or shortness of breath.   Objective:    General: Well Developed, well nourished, and in no acute distress.  Neuro: Alert and oriented x3, extra-ocular muscles intact, sensation grossly intact.  HEENT: Normocephalic, atraumatic, pupils equal round reactive to light, neck supple, no masses, no lymphadenopathy, thyroid nonpalpable.  Skin: Warm and dry, no rashes. Cardiac: Regular rate and rhythm, no murmurs rubs or gallops, no lower extremity edema.  Respiratory: Clear to auscultation bilaterally. Not using accessory muscles, speaking in full sentences. Neck: Positive Spurling's maneuver on the right with reproduction of pain. Full neck range of motion Grip strength and sensation normal in bilateral hands Strength good C4 to T1 distribution No sensory  change to C4 to T1 Reflexes normal  Impression and Recommendations:    Radiculitis of right cervical region Positive Spurling maneuver. Differential still includes carpal tunnel syndrome. 5 days of prednisone, cervical spine x-rays, formal PT. Return to see me in 4 weeks, MRI if no better.   ___________________________________________ Ihor Austinhomas J. Benjamin Stainhekkekandam, M.D., ABFM., CAQSM. Primary Care and Sports Medicine [facility]  Adjunct Professor of Family Medicine  [employer]

## 2019-04-24 ENCOUNTER — Encounter: Payer: Self-pay | Admitting: Physical Therapy

## 2019-04-24 ENCOUNTER — Other Ambulatory Visit: Payer: Self-pay

## 2019-04-24 ENCOUNTER — Ambulatory Visit (INDEPENDENT_AMBULATORY_CARE_PROVIDER_SITE_OTHER): Payer: 59 | Admitting: Physical Therapy

## 2019-04-24 DIAGNOSIS — M5412 Radiculopathy, cervical region: Secondary | ICD-10-CM | POA: Diagnosis not present

## 2019-04-24 DIAGNOSIS — R293 Abnormal posture: Secondary | ICD-10-CM

## 2019-04-24 NOTE — Patient Instructions (Signed)
Access Code: [email or web address]  URL: [email or web address]  Date: 04/24/2019  Prepared by: Faustino Congress   Exercises  Supine Chin Tuck - 10 reps - 1 sets - 5 sec hold - 2x daily - 7x weekly  Seated Cervical Retraction - 10 reps - 1 sets - 5 sec hold - 2x daily - 7x weekly  Shoulder External Rotation and Scapular Retraction - 10 reps - 1 sets - 1-2 sec hold - 2x daily - 7x weekly  Median Nerve Flossing - Tray - 10 reps - 1 sets - 2x daily - 7x weekly  Patient Education  Sleep Hygiene

## 2019-04-24 NOTE — Therapy (Signed)
[facility] [place], [place], [address] Phone: [phone]   Fax:  [phone]  Physical Therapy Evaluation  Patient Details  Name: Russell Spencer MRN: [id number] Date of Birth: August 08, 1966 Referring Provider (PT): Silverio Decamp, MD   Encounter Date: 04/24/2019  PT End of Session - 04/24/19 1122    Visit Number  1    Number of Visits  12    Date for PT Re-Evaluation  06/05/19    PT Start Time  0938    PT Stop Time  1022    PT Time Calculation (min)  44 min    Activity Tolerance  Patient tolerated treatment well    Behavior During Therapy  [facility] for tasks assessed/performed       Past Medical History:  Diagnosis Date  . A-fib ([place]) 12/28/2017  . Anxiety   . Paroxysmal SVT (supraventricular tachycardia) ([place]) 02/04/2010   Qualifier: Diagnosis of  By: Madilyn Fireman MD, Barnetta Chapel      History reviewed. No pertinent surgical history.  There were no vitals filed for this visit.   Subjective Assessment - 04/24/19 0940    Subjective  Pt is a 53 y/o male who presents to OPPT with 2-3 week history of  Rt sided burning and pain in hand, which feels it is coming from neck.  Pt denies any pain in neck or shoulder and symptoms are only present with Rt arm extended out or downward.    Diagnostic tests  xrays: negative    Patient Stated Goals  improve symptoms    Currently in Pain?  Yes    Pain Score  0-No pain    Pain Location  Hand    Pain Orientation  Right    Pain Descriptors / Indicators  Burning    Pain Onset  1 to 4 weeks ago    Pain Frequency  Intermittent    Aggravating Factors   reaching out and downward    Pain Relieving Factors  avoiding         [facility] PT Assessment - 04/24/19 0944      Assessment   Medical Diagnosis  M54.12 (ICD-10-CM) - Radiculitis of right cervical region    Referring Provider (PT)  Silverio Decamp, MD    Onset Date/Surgical Date  --   2-3 weeks ago   Hand Dominance   Right    Next MD Visit  July    Prior Therapy  none      Precautions   Precautions  None      Restrictions   Weight Bearing Restrictions  No      Balance Screen   Has the patient fallen in the past 6 months  No    Has the patient had a decrease in activity level because of a fear of falling?   No    Is the patient reluctant to leave their home because of a fear of falling?   No      Prior Function   Level of Independence  Independent    Vocation  Retired    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, golf      Cognition   Overall Cognitive Status  Within Functional Limits for tasks assessed      Observation/Other Assessments   Focus on Therapeutic Outcomes (FOTO)   78 (22% limited; predicted 15% limited)      Posture/Postural Control  Posture/Postural Control  Postural limitations      ROM / Strength   AROM / PROM / Strength  AROM;Strength      AROM   Overall AROM Comments  no pain with all motions    AROM Assessment Site  Cervical    Cervical Flexion  32    Cervical Extension  50    Cervical - Right Side Bend  32    Cervical - Left Side Bend  22    Cervical - Right Rotation  62    Cervical - Left Rotation  65      Strength   Strength Assessment Site  Shoulder;Elbow;Hand    Right/Left Shoulder  Right;Left    Right Shoulder Flexion  5/5    Right Shoulder ABduction  5/5    Right Shoulder Internal Rotation  5/5    Right Shoulder External Rotation  5/5    Left Shoulder Flexion  5/5    Left Shoulder ABduction  5/5    Left Shoulder Internal Rotation  5/5    Left Shoulder External Rotation  5/5    Right/Left Elbow  Right;Left    Right Elbow Flexion  5/5    Right Elbow Extension  5/5    Left Elbow Flexion  5/5    Left Elbow Extension  5/5    Right/Left hand  Right;Left    Right Hand Grip (lbs)  78.67   95, 74, 67   Left Hand Grip (lbs)  82.67   94, 76, 78     Special Tests    Special  Tests  Cervical    Other special tests  median nerve tension tests positive on Rt    Cervical Tests  Spurling's;Dictraction      Spurling's   Findings  Negative      Distraction Test   Findngs  Negative                Objective measurements completed on examination: See above findings.      [facility] Adult PT Treatment/Exercise - 04/24/19 0944      Self-Care   Self-Care  Other Self-Care Comments    Other Self-Care Comments   inflammatory process and what can exacerbate inflammation - sleep, diet (eliminate added sugar), stress      Exercises   Exercises  Neck      Neck Exercises: Seated   Other Seated Exercise  median nerve flossing x 5 reps on Rt    Other Seated Exercise  bil er with scap retraction x 10 reps; cues for technique      Neck Exercises: Supine   Neck Retraction  10 reps;5 secs    Neck Retraction Limitations  instructed in seated and supine             PT Education - 04/24/19 1122    Education Details  HEP, sleep hygiene    Person(s) Educated  Patient    Methods  Explanation;Demonstration;Handout    Comprehension  Verbalized understanding;Returned demonstration;Need further instruction          PT Long Term Goals - 04/24/19 1346      PT LONG TERM GOAL #1   Title  independent with HEP    Status  New    Target Date  06/05/19      PT LONG TERM GOAL #2   Title  improve FOTO score to </= 15% limited for improved function    Status  New    Target Date  06/05/19  PT LONG TERM GOAL #3   Title  demonstrate negative median nerve tension testing for improved symptoms    Status  New    Target Date  06/05/19      PT LONG TERM GOAL #4   Title  report no pain with provoking movements for resolve of symptoms    Status  New    Target Date  06/05/19             Plan - 04/24/19 1123    Clinical Impression Statement  PT is a 53 y/o male who presents to OPPT for Rt cervical radiculitis presenting only with buring into palm of hand.   Pt demonstrates mild grip strength weakness on Rt and positive neural tension tests.  Will benefit from PT to address deficits.    Personal Factors and Comorbidities  Comorbidity 1    Comorbidities  HTN    Examination-Activity Limitations  Other   reaching forward, out, down, back   Examination-Participation Restrictions  Driving    Stability/Clinical Decision Making  Stable/Uncomplicated    Clinical Decision Making  Low    Rehab Potential  Good    PT Frequency  2x / week    PT Duration  6 weeks    PT Treatment/Interventions  ADLs/Self Care Home Management;Cryotherapy;Electrical Stimulation;Iontophoresis 4mg /ml Dexamethasone;Moist Heat;Traction;Therapeutic exercise;Ultrasound;Neuromuscular re-education;Patient/family education;Manual techniques;Taping;Dry needling;Passive range of motion    PT Next Visit Plan  review HEP, posture exercises, manual nerve slides/glides PRN    PT Home Exercise Plan  Access Code: [email or web address]    Consulted and Agree with Plan of Care  Patient       Patient will benefit from skilled therapeutic intervention in order to improve the following deficits and impairments:  Hypomobility, Pain, Impaired UE functional use, Increased fascial restricitons, Increased muscle spasms, Decreased strength, Impaired flexibility, Postural dysfunction  Visit Diagnosis: 1. Radiculopathy, cervical region   2. Abnormal posture        Problem List Patient Active Problem List   Diagnosis Date Noted  . Radiculitis of right cervical region 04/15/2019  . A-fib ([facility]) 12/28/2017  . Paroxysmal SVT (supraventricular tachycardia) ([facility]) 02/04/2010  . DYSHIDROTIC ECZEMA 02/24/2009  . FEVER BLISTER 10/29/2008  . GERD 06/18/2008  . SYMPTOM, HEADACHE 07/16/2007  . ANXIETY DISORDER, GENERALIZED 05/14/2007  . Hyperlipidemia 05/07/2007  . Essential hypertension 05/03/2007      Clarita CraneStephanie F Everleigh Colclasure, PT, DPT 04/24/19 1:52 PM     [facility]  [place] [address] [place], [place], [address] Phone: [phone]   Fax:  [phone]  Name: Russell Spencer MRN: [id number] Date of Birth: 09-27-1966

## 2019-04-26 ENCOUNTER — Encounter: Payer: Self-pay | Admitting: Physical Therapy

## 2019-04-26 ENCOUNTER — Other Ambulatory Visit: Payer: Self-pay

## 2019-04-26 ENCOUNTER — Ambulatory Visit (INDEPENDENT_AMBULATORY_CARE_PROVIDER_SITE_OTHER): Payer: 59 | Admitting: Physical Therapy

## 2019-04-26 DIAGNOSIS — M5412 Radiculopathy, cervical region: Secondary | ICD-10-CM

## 2019-04-26 DIAGNOSIS — R293 Abnormal posture: Secondary | ICD-10-CM

## 2019-04-26 NOTE — Therapy (Signed)
[facility] [address] [place], [place], [address] Phone: [phone]   Fax:  [phone]  Physical Therapy Treatment  Patient Details  Name: Russell Spencer MRN: [id number] Date of Birth: July 28, 1966 Referring Provider (PT): Monica Bectonhekkekandam, Thomas J, MD   Encounter Date: 04/26/2019  PT End of Session - 04/26/19 1005    Visit Number  2    Number of Visits  12    Date for PT Re-Evaluation  06/05/19    PT Start Time  1005    PT Stop Time  1058    PT Time Calculation (min)  53 min       Past Medical History:  Diagnosis Date  . A-fib (HCC) 12/28/2017  . Anxiety   . Paroxysmal SVT (supraventricular tachycardia) (HCC) 02/04/2010   Qualifier: Diagnosis of  By: Linford ArnoldMetheney MD, Santina Evansatherine      History reviewed. No pertinent surgical history.  There were no vitals filed for this visit.  Subjective Assessment - 04/26/19 1013    Subjective  Pt reports no real change since last visit. Still only has burning in Rt hand when reaching down for things.    Currently in Pain?  No/denies    Pain Score  --   0/10 at rest; 9/10 when reaching for things.   Pain Location  Hand    Pain Orientation  Right    Pain Descriptors / Indicators  Burning         [facility] PT Assessment - 04/26/19 0001      Assessment   Medical Diagnosis  M54.12 (ICD-10-CM) - Radiculitis of right cervical region    Referring Provider (PT)  Monica Bectonhekkekandam, Thomas J, MD    Onset Date/Surgical Date  --   2-3 weeks ago   Hand Dominance  Right    Next MD Visit  July    Prior Therapy  none       [facility] - 04/26/19 0001      Neck Exercises: Machines for Strengthening   UBE (Upper Arm Bike)  L1: 1 min forward, 1.5 min backward.       Neck Exercises: Standing   Other Standing Exercises  W's and L's with 5-10 sec hold x 10, mirror for feedback.     Other Standing Exercises  axial ext (chin tuck) with back against noodle x 3 sec x 10 reps;   Median nerve glide per HEP in standing, with RUE out to side x 5 reps, 3 sets with freq cues on form.       Shoulder Exercises: Standing   Row  10 reps;Strengthening;Theraband   3 sec hold in retraction   Theraband Level (Shoulder Row)  Level 3 (Green)    Row Limitations  cues for posture and form .      Shoulder Exercises: Stretch   Other Shoulder Stretches  3 position doorway stretch x 20 sec x 2 reps each (multiple cues for form/technique)     Other Shoulder Stretches  supine median nerve glide (RUE 1/2 off table, without head movement)x 15       Hand Exercises for Cervical Radiculopathy   Other Hand Exercise for Cervical Radiculopathy  median nerve stretch with arm in neutral pulling Rt wrist into ext, palm up x 5 reps       Manual Therapy   Manual Therapy  Soft tissue mobilization    Soft tissue mobilization  IASTM to Rt upper trap, levator, Rt thenar eminance, ant wrist and lateral forearm -  to decrease fascial restrictions.                   PT Long Term Goals - 04/24/19 1346      PT LONG TERM GOAL #1   Title  independent with HEP    Status  New    Target Date  06/05/19      PT LONG TERM GOAL #2   Title  improve FOTO score to </= 15% limited for improved function    Status  New    Target Date  06/05/19      PT LONG TERM GOAL #3   Title  demonstrate negative median nerve tension testing for improved symptoms    Status  New    Target Date  06/05/19      PT LONG TERM GOAL #4   Title  report no pain with provoking movements for resolve of symptoms    Status  New    Target Date  06/05/19            Plan - 04/26/19 1401    Clinical Impression Statement  Pt continues with burning pain with median nerve stretches.  He tolerated all exercises, except standing nerve glides, well without difficulty.  Pt's upper trap very sensitive to IASMT and had petichia very quick after initiating technique (no pain noted). Time spent educating pt on daily postures that  can contribute to symptoms. Goals are ongoing.    Personal Factors and Comorbidities  Comorbidity 1    Comorbidities  HTN    Examination-Activity Limitations  Other   reaching forward, out, down, back   Examination-Participation Restrictions  Driving    Stability/Clinical Decision Making  Stable/Uncomplicated    Rehab Potential  Good    PT Frequency  2x / week    PT Duration  6 weeks    PT Treatment/Interventions  ADLs/Self Care Home Management;Cryotherapy;Electrical Stimulation;Iontophoresis 4mg /ml Dexamethasone;Moist Heat;Traction;Therapeutic exercise;Ultrasound;Neuromuscular re-education;Patient/family education;Manual techniques;Taping;Dry needling;Passive range of motion    PT Next Visit Plan  review HEP, posture exercises, manual therapy ; nerve slides/glides PRN    PT Home Exercise Plan  Access Code: [email or web address]    Consulted and Agree with Plan of Care  Patient       Patient will benefit from skilled therapeutic intervention in order to improve the following deficits and impairments:  Hypomobility, Pain, Impaired UE functional use, Increased fascial restricitons, Increased muscle spasms, Decreased strength, Impaired flexibility, Postural dysfunction  Visit Diagnosis: 1. Radiculopathy, cervical region   2. Abnormal posture        Problem List Patient Active Problem List   Diagnosis Date Noted  . Radiculitis of right cervical region 04/15/2019  . A-fib ([place]) 12/28/2017  . Paroxysmal SVT (supraventricular tachycardia) ([place]) 02/04/2010  . DYSHIDROTIC ECZEMA 02/24/2009  . FEVER BLISTER 10/29/2008  . GERD 06/18/2008  . SYMPTOM, HEADACHE 07/16/2007  . ANXIETY DISORDER, GENERALIZED 05/14/2007  . Hyperlipidemia 05/07/2007  . Essential hypertension 05/03/2007   Kerin Perna, PTA 04/26/19 2:10 PM  [place], [place], [address] Phone: [phone]   Fax:  [phone]  Name: Russell Spencer MRN: [id number] Date of Birth: 1966/03/14

## 2019-04-26 NOTE — Patient Instructions (Signed)
MEDIAN NERVE: Mobilization III    Stand with right elbow resting at side, palm up. Use opposite hand to pull hand and fingers back. Do _1__ sets of _10__ repetitions per session. Do __2-3_ sessions per day.  Copyright  VHI. All rights reserved.

## 2019-04-30 ENCOUNTER — Other Ambulatory Visit: Payer: Self-pay

## 2019-04-30 ENCOUNTER — Encounter: Payer: Self-pay | Admitting: Physical Therapy

## 2019-04-30 ENCOUNTER — Ambulatory Visit (INDEPENDENT_AMBULATORY_CARE_PROVIDER_SITE_OTHER): Payer: 59 | Admitting: Physical Therapy

## 2019-04-30 DIAGNOSIS — M5412 Radiculopathy, cervical region: Secondary | ICD-10-CM | POA: Diagnosis not present

## 2019-04-30 DIAGNOSIS — R293 Abnormal posture: Secondary | ICD-10-CM

## 2019-04-30 NOTE — Therapy (Signed)
[facility] [address] [place], [place], [address] Phone: [phone]   Fax:  [phone]  Physical Therapy Treatment  Patient Details  Name: Russell Spencer MRN: [id number] Date of Birth: [birth year] Referring Provider (PT): Monica Bectonhekkekandam, Thomas J, MD   Encounter Date: 04/30/2019  PT End of Session - 04/30/19 1344    Visit Number  3    Number of Visits  12    Date for PT Re-Evaluation  06/05/19    PT Start Time  1335    PT Stop Time  1422    PT Time Calculation (min)  47 min    Activity Tolerance  No increased pain;Patient tolerated treatment well    Behavior During Therapy  [facility] for tasks assessed/performed       Past Medical History:  Diagnosis Date  . A-fib (HCC) 12/28/2017  . Anxiety   . Paroxysmal SVT (supraventricular tachycardia) (HCC) 02/04/2010   Qualifier: Diagnosis of  By: Linford ArnoldMetheney MD, Santina Evansatherine      History reviewed. No pertinent surgical history.  There were no vitals filed for this visit.  Subjective Assessment - 04/30/19 1339    Subjective  Pt reports he has been avoiding aggrivating positions for his Rt arm. He reports the pain in hand is not as intense when it happens. He has been doing some of the exercises daily.    Currently in Pain?  No/denies    Pain Score  0-No pain         [facility] PT Assessment - 04/30/19 0001      Assessment   Medical Diagnosis  M54.12 (ICD-10-CM) - Radiculitis of right cervical region    Referring Provider (PT)  Monica Bectonhekkekandam, Thomas J, MD    Onset Date/Surgical Date  --   2-3 weeks ago   Hand Dominance  Right    Next MD Visit  July    Prior Therapy  none      AROM   Cervical Flexion  52    Cervical Extension  59    Cervical - Right Side Bend  40    Cervical - Left Side Bend  42    Cervical - Right Rotation  62    Cervical - Left Rotation  59      Strength   Right Hand Grip (lbs)  114   102, 114   Left Hand Grip (lbs)  111   109, 111       [facility] Adult  PT Treatment/Exercise - 04/30/19 0001      Neck Exercises: Machines for Strengthening   UBE (Upper Arm Bike)  L1: 1 min forward, 1.5 min backward.       Neck Exercises: Standing   Other Standing Exercises  chin tucks x 5 reps of 3 sec with cues; Row and ext with green band x 10 reps each, with 3 sec hold (cues for form);  wall angel x 5 reps       Shoulder Exercises: Stretch   Other Shoulder Stretches  3 position doorway stretch x 20 sec x 2 reps each (multiple cues for form/technique) ; ulnar nerve stretch ("bird man" position - challenging on RUE, x 3 reps     Other Shoulder Stretches  supine median nerve glide (RUE 3/4 off table, without head movement)x 10;  standing Rt tricep stretch x 20 sec x 2 reps;  lat stretch with hands on elelvated surface x 20 x 2.       Hand Exercises for Cervical Radiculopathy  Other Hand Exercise for Cervical Radiculopathy  median nerve stretch with arm in neutral pulling Rt wrist into ext, palm up x 5 reps       Manual Therapy   Soft tissue mobilization  IASTM to Rt upper trap, levator, Rt tricep, Rt wrist and thenar eminance, ant wrist and lateral forearm - to decrease fascial restrictions.       Neck Exercises: Stretches   Upper Trapezius Stretch  Left;2 reps;20 seconds                  PT Long Term Goals - 04/24/19 1346      PT LONG TERM GOAL #1   Title  independent with HEP    Status  New    Target Date  06/05/19      PT LONG TERM GOAL #2   Title  improve FOTO score to </= 15% limited for improved function    Status  New    Target Date  06/05/19      PT LONG TERM GOAL #3   Title  demonstrate negative median nerve tension testing for improved symptoms    Status  New    Target Date  06/05/19      PT LONG TERM GOAL #4   Title  report no pain with provoking movements for resolve of symptoms    Status  New    Target Date  06/05/19            Plan - 04/30/19 1412    Clinical Impression Statement  Pt demonstrated improved  grip strength and cervical ROM since eval.  Overall decrease in radicular symptoms.  Progressing well towards goals.    Personal Factors and Comorbidities  Comorbidity 1    Comorbidities  HTN    Examination-Activity Limitations  Other   reaching forward, out, down, back   Examination-Participation Restrictions  Driving    Stability/Clinical Decision Making  Stable/Uncomplicated    Rehab Potential  Good    PT Frequency  2x / week    PT Duration  6 weeks    PT Treatment/Interventions  ADLs/Self Care Home Management;Cryotherapy;Electrical Stimulation;Iontophoresis 4mg /ml Dexamethasone;Moist Heat;Traction;Therapeutic exercise;Ultrasound;Neuromuscular re-education;Patient/family education;Manual techniques;Taping;Dry needling;Passive range of motion    PT Next Visit Plan  review HEP, posture exercises, manual therapy ; nerve slides/glides PRN    PT Home Exercise Plan  Access Code: [email or web address]    Consulted and Agree with Plan of Care  Patient       Patient will benefit from skilled therapeutic intervention in order to improve the following deficits and impairments:  Hypomobility, Pain, Impaired UE functional use, Increased fascial restricitons, Increased muscle spasms, Decreased strength, Impaired flexibility, Postural dysfunction  Visit Diagnosis: 1. Radiculopathy, cervical region   2. Abnormal posture        Problem List Patient Active Problem List   Diagnosis Date Noted  . Radiculitis of right cervical region 04/15/2019  . A-fib ([place]) 12/28/2017  . Paroxysmal SVT (supraventricular tachycardia) ([place]) 02/04/2010  . DYSHIDROTIC ECZEMA 02/24/2009  . FEVER BLISTER 10/29/2008  . GERD 06/18/2008  . SYMPTOM, HEADACHE 07/16/2007  . ANXIETY DISORDER, GENERALIZED 05/14/2007  . Hyperlipidemia 05/07/2007  . Essential hypertension 05/03/2007   Kerin Perna, PTA 04/30/19 2:32 PM  [place], [place],  [address] Phone: [phone]   Fax:  [phone]  Name: Jedrick Hutcherson MRN: [id number] Date of Birth: 07-09-1966

## 2019-05-02 ENCOUNTER — Ambulatory Visit (INDEPENDENT_AMBULATORY_CARE_PROVIDER_SITE_OTHER): Payer: 59 | Admitting: Physical Therapy

## 2019-05-02 ENCOUNTER — Other Ambulatory Visit: Payer: Self-pay

## 2019-05-02 DIAGNOSIS — R293 Abnormal posture: Secondary | ICD-10-CM | POA: Diagnosis not present

## 2019-05-02 DIAGNOSIS — M5412 Radiculopathy, cervical region: Secondary | ICD-10-CM | POA: Diagnosis not present

## 2019-05-02 NOTE — Therapy (Signed)
[facility] [address] [place], [place], [address] Phone: [phone]   Fax:  [phone]  Physical Therapy Treatment  Patient Details  Name: Russell Spencer MRN: [id number] Date of Birth: 02/28/1966 Referring Provider (PT): Monica Bectonhekkekandam, Thomas J, MD   Encounter Date: 05/02/2019  PT End of Session - 05/02/19 1405    Visit Number  4    Number of Visits  12    Date for PT Re-Evaluation  06/05/19    PT Start Time  1402    PT Stop Time  1442    PT Time Calculation (min)  40 min    Activity Tolerance  No increased pain;Patient tolerated treatment well    Behavior During Therapy  [facility] for tasks assessed/performed       Past Medical History:  Diagnosis Date  . A-fib (HCC) 12/28/2017  . Anxiety   . Paroxysmal SVT (supraventricular tachycardia) (HCC) 02/04/2010   Qualifier: Diagnosis of  By: Linford ArnoldMetheney MD, Santina Evansatherine      No past surgical history on file.  There were no vitals filed for this visit.  Subjective Assessment - 05/02/19 1408    Subjective  Pt reports the pain in his hand is getting less; only 3/10 when he reaches now.  For the most part he is avoiding agrivating positions.  Reports continued compliance with HEP.    Currently in Pain?  No/denies    Pain Score  0-No pain    Pain Orientation  Right         [facility] - 05/02/19 0001      Assessment   Medical Diagnosis  M54.12 (ICD-10-CM) - Radiculitis of right cervical region    Referring Provider (PT)  Monica Bectonhekkekandam, Thomas J, MD    Onset Date/Surgical Date  --   2-3 weeks ago   Hand Dominance  Right    Next MD Visit  05/13/19    Prior Therapy  none       [facility] Adult PT Treatment/Exercise - 05/02/19 0001      Neck Exercises: Machines for Strengthening   UBE (Upper Arm Bike)  L2: 1 min forward, 1.5 min backward.       Neck Exercises: Standing   Neck Retraction  5 reps;5 secs    Other Standing Exercises  W's x 5 sec x 5 reps    Other Standing  Exercises   Row and ext with green band x 10 reps each, with 3 sec hold (cues for form);Rt bow and arrow with green band x 10;  wall angel x 5 reps       Neck Exercises: Prone   W Back  10 reps   with axial ext      Shoulder Exercises: Stretch   Other Shoulder Stretches  standing median nerve glide x 10 reps with mirror for feedback; 3 position doorway stretch x 20 sec x 2 reps each position;  Rt bicep stretch holding door frame x 5 reps x 20 sec       Hand Exercises for Cervical Radiculopathy   Other Hand Exercise for Cervical Radiculopathy  Rt thumb stretch (into flex/ abdct) x 20 sec;  Rt wrist flexor stretch x 20 sec x 2 reps       Manual Therapy   Soft tissue mobilization  IASTM to Rt upper trap, levator, Rt bicep, Rt wrist and thenar eminance, ant wrist and lateral forearm - to decrease fascial restrictions.  PT Long Term Goals - 04/24/19 1346      PT LONG TERM GOAL #1   Title  independent with HEP    Status  New    Target Date  06/05/19      PT LONG TERM GOAL #2   Title  improve FOTO score to </= 15% limited for improved function    Status  New    Target Date  06/05/19      PT LONG TERM GOAL #3   Title  demonstrate negative median nerve tension testing for improved symptoms    Status  New    Target Date  06/05/19      PT LONG TERM GOAL #4   Title  report no pain with provoking movements for resolve of symptoms    Status  New    Target Date  06/05/19            Plan - 05/02/19 1416    Clinical Impression Statement  Pt reporting reduction of radicular symptoms in Rt hand; no longer 9/10 - now 3/10 when it happens.  Pt had some increase in symptoms with Rt bicep stretch at door frame, eliminated with rest.  After IASTM, pt able to perform neural tension test without pain.  Progressing well towards goals.    Personal Factors and Comorbidities  Comorbidity 1    Comorbidities  HTN    Examination-Activity Limitations  Other   reaching  forward, out, down, back   Examination-Participation Restrictions  Driving    Stability/Clinical Decision Making  Stable/Uncomplicated    Rehab Potential  Good    PT Frequency  2x / week    PT Duration  6 weeks    PT Treatment/Interventions  ADLs/Self Care Home Management;Cryotherapy;Electrical Stimulation;Iontophoresis 4mg /ml Dexamethasone;Moist Heat;Traction;Therapeutic exercise;Ultrasound;Neuromuscular re-education;Patient/family education;Manual techniques;Taping;Dry needling;Passive range of motion    PT Next Visit Plan  review HEP, posture exercises, manual therapy ; nerve slides/glides PRN    PT Home Exercise Plan  Access Code: [email or web address]    Consulted and Agree with Plan of Care  Patient       Patient will benefit from skilled therapeutic intervention in order to improve the following deficits and impairments:  Hypomobility, Pain, Impaired UE functional use, Increased fascial restricitons, Increased muscle spasms, Decreased strength, Impaired flexibility, Postural dysfunction  Visit Diagnosis: 1. Radiculopathy, cervical region   2. Abnormal posture        Problem List Patient Active Problem List   Diagnosis Date Noted  . Radiculitis of right cervical region 04/15/2019  . A-fib ([place]) 12/28/2017  . Paroxysmal SVT (supraventricular tachycardia) ([place]) 02/04/2010  . DYSHIDROTIC ECZEMA 02/24/2009  . FEVER BLISTER 10/29/2008  . GERD 06/18/2008  . SYMPTOM, HEADACHE 07/16/2007  . ANXIETY DISORDER, GENERALIZED 05/14/2007  . Hyperlipidemia 05/07/2007  . Essential hypertension 05/03/2007   Kerin Perna, PTA 05/02/19 2:56 PM  [place], [place], [address] Phone: [phone]   Fax:  [phone]  Name: Russell Spencer MRN: [id number] Date of Birth: Jan 23, 1966

## 2019-05-06 ENCOUNTER — Encounter: Payer: 59 | Admitting: Physical Therapy

## 2019-05-09 ENCOUNTER — Encounter: Payer: Self-pay | Admitting: Physical Therapy

## 2019-05-09 ENCOUNTER — Ambulatory Visit (INDEPENDENT_AMBULATORY_CARE_PROVIDER_SITE_OTHER): Payer: 59 | Admitting: Physical Therapy

## 2019-05-09 ENCOUNTER — Other Ambulatory Visit: Payer: Self-pay

## 2019-05-09 DIAGNOSIS — R293 Abnormal posture: Secondary | ICD-10-CM

## 2019-05-09 DIAGNOSIS — M5412 Radiculopathy, cervical region: Secondary | ICD-10-CM | POA: Diagnosis not present

## 2019-05-09 NOTE — Therapy (Addendum)
[facility] [place], [place], [address] Phone: [phone]   Fax:  [phone]  Physical Therapy Treatment  Patient Details  Name: Russell Spencer MRN: [id number] Date of Birth: Oct 20, 1966 Referring Provider (PT): Silverio Decamp, MD   Encounter Date: 05/09/2019  PT End of Session - 05/09/19 0904    Visit Number  5    Number of Visits  12    Date for PT Re-Evaluation  06/05/19    PT Start Time  0904    PT Stop Time  0945    PT Time Calculation (min)  41 min    Activity Tolerance  Patient tolerated treatment well;No increased pain    Behavior During Therapy  WFL for tasks assessed/performed       Past Medical History:  Diagnosis Date  . A-fib ([place]) 12/28/2017  . Anxiety   . Paroxysmal SVT (supraventricular tachycardia) ([place]) 02/04/2010   Qualifier: Diagnosis of  By: Madilyn Fireman MD, Barnetta Chapel      History reviewed. No pertinent surgical history.  There were no vitals filed for this visit.  Subjective Assessment - 05/09/19 0909    Subjective  Pt reporting only sporadic pain in his Rt palm with reaching to the front/ side.  Pain is not as intense as it once was.  Pain rating is variable, 3-8/10 with reaching; pain subsides with positional changes.  He reports he has done HEP daily.    Currently in Pain?  No/denies    Pain Score  0-No pain         [facility] PT Assessment - 05/09/19 0001      Assessment   Medical Diagnosis  M54.12 (ICD-10-CM) - Radiculitis of right cervical region    Referring Provider (PT)  Silverio Decamp, MD    Onset Date/Surgical Date  --   2-3 weeks ago   Hand Dominance  Right    Next MD Visit  05/13/19    Prior Therapy  none      Observation/Other Assessments   Focus on Therapeutic Outcomes (FOTO)   82 (18% limitation)       [facility] Adult PT Treatment/Exercise - 05/09/19 0001      Neck Exercises: Machines for Strengthening   UBE (Upper Arm Bike)  L3: 2 min forward, 1  min backward.       Neck Exercises: Theraband   Shoulder Extension  10 reps;Green    Rows  10 reps;Green      Neck Exercises: Standing   Other Standing Exercises  wall angels x 5 reps, 2 sets      Neck Exercises: Prone   Axial Exension  5 reps    W Back  10 reps   with axial ext    Shoulder Extension  10 reps   3 sec hold     Shoulder Exercises: Stretch   Other Shoulder Stretches  standing median nerve glide x 10 reps with mirror for feedback; 3 position doorway stretch x 20 sec x 2 reps each position;  Rt bicep stretch holding door frame x 5 reps x 20 sec       Hand Exercises for Cervical Radiculopathy   Other Hand Exercise for Cervical Radiculopathy  Rt thumb stretch (into flex/ abdct) x 20 sec;  Rt wrist flexor stretch x 20 sec x 2 reps       Manual Therapy   Soft tissue mobilization  IASTM to Rt upper trap, levator, Rt bicep, Rt wrist and thenar eminance, ant  wrist and lateral forearm - to decrease fascial restrictions.       Neck Exercises: Stretches   Upper Trapezius Stretch  Left;2 reps;20 seconds                  PT Long Term Goals - 05/09/19 0927      PT LONG TERM GOAL #1   Title  independent with HEP    Status  On-going      PT LONG TERM GOAL #2   Title  improve FOTO score to </= 15% limited for improved function    Status  On-going      PT LONG TERM GOAL #3   Title  demonstrate negative median nerve tension testing for improved symptoms    Status  Partially Met      PT LONG TERM GOAL #4   Title  report no pain with provoking movements for resolve of symptoms    Status  Partially Met            Plan - 05/09/19 0957    Clinical Impression Statement  Pt reporting less frequent, less intense episodes of pain in his Rt hand.  Pt tolerated all exercises well, without increase in symptoms.  Posture is improving.  Pt has partially met his goals and is progressing well.  Pt will benefit from continued PT intervention to maximize mobility without  pain.    Personal Factors and Comorbidities  Comorbidity 1    Comorbidities  HTN    Examination-Activity Limitations  Other   reaching forward, out, down, back   Examination-Participation Restrictions  Driving    Stability/Clinical Decision Making  Stable/Uncomplicated    Rehab Potential  Good    PT Frequency  2x / week    PT Duration  6 weeks    PT Treatment/Interventions  ADLs/Self Care Home Management;Cryotherapy;Electrical Stimulation;Iontophoresis '4mg'$ /ml Dexamethasone;Moist Heat;Traction;Therapeutic exercise;Ultrasound;Neuromuscular re-education;Patient/family education;Manual techniques;Taping;Dry needling;Passive range of motion    PT Next Visit Plan  review HEP, posture exercises, manual therapy ; nerve slides/glides PRN    PT Home Exercise Plan  Access Code: [email or web address]    Consulted and Agree with Plan of Care  Patient       Patient will benefit from skilled therapeutic intervention in order to improve the following deficits and impairments:  Hypomobility, Pain, Impaired UE functional use, Increased fascial restricitons, Increased muscle spasms, Decreased strength, Impaired flexibility, Postural dysfunction  Visit Diagnosis: 1. Radiculopathy, cervical region   2. Abnormal posture        Problem List Patient Active Problem List   Diagnosis Date Noted  . Radiculitis of right cervical region 04/15/2019  . A-fib ([place]) 12/28/2017  . Paroxysmal SVT (supraventricular tachycardia) ([place] [place]) 02/04/2010  . DYSHIDROTIC ECZEMA 02/24/2009  . FEVER BLISTER 10/29/2008  . GERD 06/18/2008  . SYMPTOM, HEADACHE 07/16/2007  . ANXIETY DISORDER, GENERALIZED 05/14/2007  . Hyperlipidemia 05/07/2007  . Essential hypertension 05/03/2007   Kerin Perna, PTA 05/09/19 10:00 AM  [facility] [place], [place], [address] Phone: [phone]   Fax:  [phone]  Name: Russell Spencer MRN: [id number] Date of Birth:  Oct 17, 1966     PHYSICAL THERAPY DISCHARGE SUMMARY  Visits from Start of Care: 5  Current functional level related to goals / functional outcomes: See above   Remaining deficits: See above   Education / Equipment: HEP  Plan: Patient agrees to discharge.  Patient goals were partially met. Patient is being discharged due to being pleased with the current functional  level.  ?????    Laureen Abrahams, PT, DPT 06/27/19 8:11 AM  [facility] [address] [place], [place] [address]  [phone] (office) [phone] (fax)

## 2019-05-13 ENCOUNTER — Other Ambulatory Visit: Payer: Self-pay

## 2019-05-13 ENCOUNTER — Encounter: Payer: Self-pay | Admitting: Sports Medicine

## 2019-05-13 ENCOUNTER — Ambulatory Visit (INDEPENDENT_AMBULATORY_CARE_PROVIDER_SITE_OTHER): Payer: 59 | Admitting: Sports Medicine

## 2019-05-13 DIAGNOSIS — M5412 Radiculopathy, cervical region: Secondary | ICD-10-CM | POA: Diagnosis not present

## 2019-05-13 NOTE — Progress Notes (Signed)
Subjective:    CC: Follow-up  HPI: This is a pleasant 53 year old male, we are treating him for right cervical radiculitis, he gets discomfort with extension of his neck and extension of his shoulde, with paresthesias going into the palm of the hand.  We added prednisone, physical therapy and he is improved almost completely.  I reviewed the past medical history, family history, social history, surgical history, and allergies today and no changes were needed.  Please see the problem list section below in epic for further details.  Past Medical History: Past Medical History:  Diagnosis Date  . A-fib ([place]) 12/28/2017  . Anxiety   . Paroxysmal SVT (supraventricular tachycardia) ([place]) 02/04/2010   Qualifier: Diagnosis of  By: Madilyn Fireman MD, Barnetta Chapel     Past Surgical History: No past surgical history on file. Social History: Social History   Socioeconomic History  . Marital status: Married    Spouse name: Not on file  . Number of children: 3  . Years of education: Not on file  . Highest education level: Not on file  Occupational History  . Occupation: Retail banker     Comment: [employer]   Social Needs  . Financial resource strain: Not on file  . Food insecurity    Worry: Not on file    Inability: Not on file  . Transportation needs    Medical: Not on file    Non-medical: Not on file  Tobacco Use  . Smoking status: Never Smoker  . Smokeless tobacco: Never Used  Substance and Sexual Activity  . Alcohol use: No  . Drug use: No  . Sexual activity: Yes    Partners: Female  Lifestyle  . Physical activity    Days per week: Not on file    Minutes per session: Not on file  . Stress: Not on file  Relationships  . Social Herbalist on phone: Not on file    Gets together: Not on file    Attends religious service: Not on file    Active member of club or organization: Not on file    Attends meetings of clubs or organizations: Not on file    Relationship status: Not on  file  Other Topics Concern  . Not on file  Social History Narrative   Some exercise, 5 x a week.     Family History: Family History  Problem Relation Age of Onset  . Diabetes Mother   . Hypertension Mother   . Hyperlipidemia Mother   . Kidney failure Mother        Dialysis   Allergies: Allergies  Allergen Reactions  . Penicillins     REACTION: rash   Medications: See med rec.  Review of Systems: No fevers, chills, night sweats, weight loss, chest pain, or shortness of breath.   Objective:    General: Well Developed, well nourished, and in no acute distress.  Neuro: Alert and oriented x3, extra-ocular muscles intact, sensation grossly intact.  HEENT: Normocephalic, atraumatic, pupils equal round reactive to light, neck supple, no masses, no lymphadenopathy, thyroid nonpalpable.  Skin: Warm and dry, no rashes. Cardiac: Regular rate and rhythm, no murmurs rubs or gallops, no lower extremity edema.  Respiratory: Clear to auscultation bilaterally. Not using accessory muscles, speaking in full sentences. Neck: Negative spurling's Full neck range of motion Grip strength and sensation normal in bilateral hands Strength good C4 to T1 distribution No sensory change to C4 to T1 Reflexes normal Right wrist: Inspection normal  with no visible erythema or swelling. ROM smooth and normal with good flexion and extension and ulnar/radial deviation that is symmetrical with opposite wrist. Palpation is normal over metacarpals, navicular, lunate, and TFCC; tendons without tenderness/ swelling No snuffbox tenderness. No tenderness over Canal of [place]. Strength 5/5 in all directions without pain. Negative tinel's and phalens signs. Negative Finkelstein sign. Negative Watson's test.  Impression and Recommendations:    Radiculitis of right cervical region Positive Spurling maneuver at the last visit, physical therapy, steroids have resolved 99% of his pain, we are unable to reproduce his  symptoms today, he is happy with how things are going and will continue his home therapy and return to see me as needed.   ___________________________________________ Ihor Austinhomas J. Benjamin Stainhekkekandam, M.D., ABFM., CAQSM. Primary Care and Sports Medicine [facility]  Adjunct Professor of Family Medicine  [employer]

## 2019-05-13 NOTE — Assessment & Plan Note (Signed)
Positive Spurling maneuver at the last visit, physical therapy, steroids have resolved 99% of his pain, we are unable to reproduce his symptoms today, he is happy with how things are going and will continue his home therapy and return to see me as needed.

## 2019-05-17 ENCOUNTER — Other Ambulatory Visit: Payer: Self-pay | Admitting: Family Medicine

## 2019-05-22 ENCOUNTER — Encounter: Payer: Self-pay | Admitting: Family Medicine

## 2019-05-22 ENCOUNTER — Other Ambulatory Visit: Payer: Self-pay

## 2019-05-22 ENCOUNTER — Ambulatory Visit (INDEPENDENT_AMBULATORY_CARE_PROVIDER_SITE_OTHER): Payer: 59 | Admitting: Family Medicine

## 2019-05-22 VITALS — BP 113/70 | HR 64 | Ht 70.0 in | Wt 153.0 lb

## 2019-05-22 DIAGNOSIS — Z1211 Encounter for screening for malignant neoplasm of colon: Secondary | ICD-10-CM | POA: Diagnosis not present

## 2019-05-22 DIAGNOSIS — E7800 Pure hypercholesterolemia, unspecified: Secondary | ICD-10-CM

## 2019-05-22 DIAGNOSIS — Z0000 Encounter for general adult medical examination without abnormal findings: Secondary | ICD-10-CM

## 2019-05-22 DIAGNOSIS — L301 Dyshidrosis [pompholyx]: Secondary | ICD-10-CM

## 2019-05-22 DIAGNOSIS — I10 Essential (primary) hypertension: Secondary | ICD-10-CM | POA: Diagnosis not present

## 2019-05-22 MED ORDER — LISINOPRIL-HYDROCHLOROTHIAZIDE 10-12.5 MG PO TABS: ORAL_TABLET | ORAL | 1 refills | Status: DC

## 2019-05-22 MED ORDER — METOPROLOL SUCCINATE ER 50 MG PO TB24: 50.0000 mg | ORAL_TABLET | Freq: Every day | ORAL | 1 refills | Status: DC

## 2019-05-22 MED ORDER — CLOBETASOL PROPIONATE 0.05 % EX CREA: 1.0000 "application " | TOPICAL_CREAM | Freq: Two times a day (BID) | CUTANEOUS | 1 refills | Status: DC

## 2019-05-22 MED ORDER — CLOBETASOL PROPIONATE 0.05 % EX FOAM: Freq: Two times a day (BID) | CUTANEOUS | 5 refills | Status: DC | PRN

## 2019-05-22 NOTE — Assessment & Plan Note (Signed)
Blood pressure looks great today.  Refills sent to pharmacy.  Follow-up in 6 months.

## 2019-05-22 NOTE — Assessment & Plan Note (Signed)
Did refill the topical steroid cream and foam.  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  He has a small flare on his left palm today.

## 2019-05-22 NOTE — Patient Instructions (Signed)

## 2019-05-22 NOTE — Progress Notes (Signed)
Established Patient Office Visit  Subjective:  Patient ID: Russell Spencer, male    DOB: 01-21-1966  Age: 53 y.o. MRN: [id number]  CC:  Chief Complaint  Patient presents with  . Annual Exam    HPI Russell Stabileyrone Basu presents for CPE.  He is doing well overall.  He recently retired from his job at [employer].  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  He is not currently exercising but says was walking at the local park a couple times a week up until a couple of months ago.  He is taking his statin without any problems or side effects.  In regards to his to striata dyshidrotic eczema it has gotten a lot better since he retired but is still having occasional flares.  He still needs a refill on his topical steroid cream and foam.  Past Medical History:  Diagnosis Date  . A-fib ([facility]) 12/28/2017  . Anxiety   . Paroxysmal SVT (supraventricular tachycardia) ([facility]) 02/04/2010   Qualifier: Diagnosis of  By: Linford ArnoldMetheney MD, Santina Evansatherine      History reviewed. No pertinent surgical history.  Family History  Problem Relation Age of Onset  . Diabetes Mother   . Hypertension Mother   . Hyperlipidemia Mother   . Kidney failure Mother        Dialysis    Social History   Socioeconomic History  . Marital status: Married    Spouse name: Not on file  . Number of children: 3  . Years of education: Not on file  . Highest education level: Not on file  Occupational History  . Occupation: Geographical information systems officerostal Manager     Comment: [employer]   Social Needs  . Financial resource strain: Not on file  . Food insecurity    Worry: Not on file    Inability: Not on file  . Transportation needs    Medical: Not on file    Non-medical: Not on file  Tobacco Use  . Smoking status: Never Smoker  . Smokeless tobacco: Never Used  Substance and Sexual Activity  . Alcohol use: No  . Drug use: No  .  Sexual activity: Yes    Partners: Female  Lifestyle  . Physical activity    Days per week: Not on file    Minutes per session: Not on file  . Stress: Not on file  Relationships  . Social Musicianconnections    Talks on phone: Not on file    Gets together: Not on file    Attends religious service: Not on file    Active member of club or organization: Not on file    Attends meetings of clubs or organizations: Not on file    Relationship status: Not on file  . Intimate partner violence    Fear of current or ex partner: Not on file    Emotionally abused: Not on file    Physically abused: Not on file    Forced sexual activity: Not on file  Other Topics Concern  . Not on file  Social History Narrative   Some exercise, 5 x a week.      Outpatient Medications Prior to Visit  Medication Sig Dispense Refill  . atorvastatin (LIPITOR) 20 MG tablet TAKE 1 TABLET (20 MG TOTAL) BY MOUTH AT BEDTIME. TAKE 1 TABLET AT BEDTIME 90 tablet  3  . clobetasol cream (TEMOVATE) 9.38 % APPLY 1 APPLICATION TOPICALLY 2 (TWO) TIMES DAILY. 30 g 1  . lisinopril-hydrochlorothiazide (PRINZIDE,ZESTORETIC) 10-12.5 MG tablet TAKE 1 TABLET BY MOUTH DAILY. LAST REFILLPLEASE CALL OFFICE TO SCHEDULE APPOINTMENT 90 tablet 0  . metoprolol succinate (TOPROL-XL) 50 MG 24 hr tablet Take 50 mg by mouth daily.     No facility-administered medications prior to visit.     Allergies  Allergen Reactions  . Penicillin G Other (See Comments)    .  Marland Kitchen Penicillins     REACTION: rash    ROS Review of Systems    Objective:    Physical Exam  Constitutional: He is oriented to person, place, and time. He appears well-developed and well-nourished.  HENT:  Head: Normocephalic and atraumatic.  Right Ear: External ear normal.  Left Ear: External ear normal.  Nose: Nose normal.  Mouth/Throat: Oropharynx is clear and moist.  Eyes: Pupils are equal, round, and reactive to light. Conjunctivae and EOM are normal.  Neck: Normal range of  motion. Neck supple. No thyromegaly present.  Cardiovascular: Normal rate, regular rhythm, normal heart sounds and intact distal pulses.  Pulmonary/Chest: Effort normal and breath sounds normal.  Abdominal: Soft. Bowel sounds are normal. He exhibits no distension and no mass. There is no abdominal tenderness. There is no rebound and no guarding.  Musculoskeletal: Normal range of motion.  Lymphadenopathy:    He has no cervical adenopathy.  Neurological: He is alert and oriented to person, place, and time. He has normal reflexes.  Skin: Skin is warm and dry.  His left palm he has an area of thickened skin with some thick white scale.  No significant erythema.  Psychiatric: He has a normal mood and affect. His behavior is normal. Judgment and thought content normal.    BP 113/70   Pulse 64   Ht 5\' 10"  (1.778 m)   Wt 153 lb (69.4 kg)   SpO2 100%   BMI 21.95 kg/m  Wt Readings from Last 3 Encounters:  05/22/19 153 lb (69.4 kg)  05/13/19 152 lb (68.9 kg)  04/15/19 151 lb (68.5 kg)     Health Maintenance Due  Topic Date Due  . Fecal DNA (Cologuard)  08/12/2016    There are no preventive care reminders to display for this patient.  Lab Results  Component Value Date   TSH 1.876 04/30/2014   Lab Results  Component Value Date   WBC 4.0 04/13/2018   HGB 13.8 04/13/2018   HCT 41.5 04/13/2018   MCV 90.4 04/13/2018   PLT 258 04/13/2018   Lab Results  Component Value Date   NA 138 04/13/2018   K 4.0 04/13/2018   CO2 30 04/13/2018   GLUCOSE 96 04/13/2018   BUN 18 04/13/2018   CREATININE 1.14 04/13/2018   BILITOT 0.9 04/13/2018   ALKPHOS 75 09/19/2016   AST 18 04/13/2018   ALT 28 04/13/2018   PROT 7.1 04/13/2018   ALBUMIN 5.1 09/19/2016   CALCIUM 10.0 04/13/2018   Lab Results  Component Value Date   CHOL 204 (H) 04/13/2018   Lab Results  Component Value Date   HDL 47 04/13/2018   Lab Results  Component Value Date   LDLCALC 136 (H) 04/13/2018   Lab Results   Component Value Date   TRIG 107 04/13/2018   Lab Results  Component Value Date   CHOLHDL 4.3 04/13/2018   Lab Results  Component Value Date   HGBA1C 4.9 04/13/2018  Assessment & Plan:   Problem List Items Addressed This Visit      Cardiovascular and Mediastinum   Essential hypertension    Blood pressure looks great today.  Refills sent to pharmacy.  Follow-up in 6 months.      Relevant Medications   lisinopril-hydrochlorothiazide (ZESTORETIC) 10-12.5 MG tablet   metoprolol succinate (TOPROL-XL) 50 MG 24 hr tablet     Musculoskeletal and Integument   DYSHIDROTIC ECZEMA    Did refill the topical steroid cream and foam.  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  He has a small flare on his left palm today.      Relevant Medications   clobetasol (OLUX) 0.05 % topical foam   clobetasol cream (TEMOVATE) 0.05 %     Other   Hyperlipidemia   Relevant Medications   lisinopril-hydrochlorothiazide (ZESTORETIC) 10-12.5 MG tablet   metoprolol succinate (TOPROL-XL) 50 MG 24 hr tablet    Other Visit Diagnoses    Wellness examination    -  Primary   Relevant Orders   PSA   Lipid panel   COMPLETE METABOLIC PANEL WITH GFR   CBC   Colon cancer screening       Relevant Orders   Cologuard     Keep up a regular exercise program and make sure you are eating a healthy diet Try to eat 4 servings of dairy a day, or if you are lactose intolerant take a calcium with vitamin D daily.  Your vaccines are up to date.  Colon cancer screening options discussed.  He would like to move forward with Cologuard.   Meds ordered this encounter  Medications  . clobetasol (OLUX) 0.05 % topical foam    Sig: Apply topically 2 (two) times daily as needed.    Dispense:  50 g    Refill:  5  . lisinopril-hydrochlorothiazide (ZESTORETIC) 10-12.5 MG tablet    Sig: TAKE 1 TABLET BY MOUTH DAILY.    Dispense:  90 tablet    Refill:  1  . metoprolol succinate (TOPROL-XL) 50 MG 24 hr  tablet    Sig: Take 1 tablet (50 mg total) by mouth daily.    Dispense:  90 tablet    Refill:  1  . clobetasol cream (TEMOVATE) 0.05 %    Sig: Apply 1 application topically 2 (two) times daily.    Dispense:  30 g    Refill:  1    Follow-up: No follow-ups on file.    Nani Gasseratherine Tonica Brasington, MD

## 2019-06-13 LAB — COLOGUARD: Cologuard: NEGATIVE

## 2019-06-14 ENCOUNTER — Telehealth: Payer: Self-pay | Admitting: Family Medicine

## 2019-06-14 NOTE — Telephone Encounter (Signed)
Please call patient: Cologuard is negative.  Repeat colon cancer screening in 3 years.

## 2019-06-17 NOTE — Telephone Encounter (Signed)
Results left on VM with call back information

## 2019-07-20 ENCOUNTER — Other Ambulatory Visit: Payer: Self-pay

## 2019-07-20 ENCOUNTER — Emergency Department (INDEPENDENT_AMBULATORY_CARE_PROVIDER_SITE_OTHER)
Admission: EM | Admit: 2019-07-20 | Discharge: 2019-07-20 | Disposition: A | Discharge status: Home / Self Care | Payer: 59 | Source: Home / Self Care

## 2019-07-20 ENCOUNTER — Encounter: Payer: Self-pay | Admitting: Emergency Medicine

## 2019-07-20 DIAGNOSIS — R21 Rash and other nonspecific skin eruption: Secondary | ICD-10-CM | POA: Diagnosis not present

## 2019-07-20 DIAGNOSIS — L298 Other pruritus: Secondary | ICD-10-CM

## 2019-07-20 MED ORDER — TRIAMCINOLONE ACETONIDE 0.1 % EX CREA: 1.0000 "application " | TOPICAL_CREAM | Freq: Two times a day (BID) | CUTANEOUS | 0 refills | Status: DC

## 2019-07-20 MED ORDER — PREDNISONE 50 MG PO TABS: 50.0000 mg | ORAL_TABLET | Freq: Every day | ORAL | 0 refills | Status: AC

## 2019-07-20 NOTE — ED Triage Notes (Signed)
Patient c/o hives on both arms, abdomen and inner thighs since Thursday, very itchy, using Cortisone cream.  No change in diet or laundry detergents.

## 2019-07-20 NOTE — Discharge Instructions (Addendum)
°  Please try the medication as prescribed. Follow up in 3-4 days if not improving, sooner if significantly worsening.  Call 911 or have someone drive you to the hospital if you develop scratchy or sore throat, tongue or lip swelling, vomiting or difficulty breathing as these are signs of a severe allergic reaction.

## 2019-07-20 NOTE — ED Provider Notes (Signed)
[facility]    CSN: [id number] Arrival date & time: 07/20/19  0907      History   Chief Complaint Chief Complaint  Patient presents with  . Rash    HPI Russell Spencer is a 53 y.o. male.   HPI  Russell Spencer is a 53 y.o. male presenting to UC with c/o gradually worsening itchy red rash that started on upper arms about 2-3 days ago, spreading down his arms, a few spots on his inner thighs and lower abdomen.  Denies pain. He has tried cortisone cream with mild relief.  Denies change in diet, medications, detergents. No sick contacts.  Denies fever, chills, n/v/d, body aches or sore throat. He has not been outside recently to get any poison ivy or similar exposure. No hx of similar rash.    Past Medical History:  Diagnosis Date  . A-fib ([place]) 12/28/2017  . Anxiety   . Paroxysmal SVT (supraventricular tachycardia) ([place]) 02/04/2010   Qualifier: Diagnosis of  By: Madilyn Fireman MD, Catherine      Patient Active Problem List   Diagnosis Date Noted  . Radiculitis of right cervical region 04/15/2019  . A-fib ([place]) 12/28/2017  . Paroxysmal SVT (supraventricular tachycardia) ([place]) 02/04/2010  . DYSHIDROTIC ECZEMA 02/24/2009  . FEVER BLISTER 10/29/2008  . GERD 06/18/2008  . SYMPTOM, HEADACHE 07/16/2007  . ANXIETY DISORDER, GENERALIZED 05/14/2007  . Hyperlipidemia 05/07/2007  . Essential hypertension 05/03/2007    History reviewed. No pertinent surgical history.     Home Medications    Prior to Admission medications   Medication Sig Start Date End Date Taking? Authorizing Provider  atorvastatin (LIPITOR) 20 MG tablet TAKE 1 TABLET (20 MG TOTAL) BY MOUTH AT BEDTIME. TAKE 1 TABLET AT BEDTIME 05/17/19  Yes Hali Marry, MD  clobetasol (OLUX) 0.05 % topical foam Apply topically 2 (two) times daily as needed. 05/22/19  Yes Hali Marry, MD  clobetasol cream (TEMOVATE) 1.44 % Apply 1 application topically 2 (two) times daily. 05/22/19  Yes Hali Marry, MD   lisinopril-hydrochlorothiazide (ZESTORETIC) 10-12.5 MG tablet TAKE 1 TABLET BY MOUTH DAILY. 05/22/19  Yes Hali Marry, MD  metoprolol succinate (TOPROL-XL) 50 MG 24 hr tablet Take 1 tablet (50 mg total) by mouth daily. 05/22/19  Yes Hali Marry, MD  predniSONE (DELTASONE) 50 MG tablet Take 1 tablet (50 mg total) by mouth daily with breakfast for 5 days. 07/20/19 07/25/19  Noe Gens, PA-C  triamcinolone cream (KENALOG) 0.1 % Apply 1 application topically 2 (two) times daily. 07/20/19   Noe Gens, PA-C    Family History Family History  Problem Relation Age of Onset  . Diabetes Mother   . Hypertension Mother   . Hyperlipidemia Mother   . Kidney failure Mother        Dialysis    Social History Social History   Tobacco Use  . Smoking status: Never Smoker  . Smokeless tobacco: Never Used  Substance Use Topics  . Alcohol use: No  . Drug use: No     Allergies   Penicillin g and Penicillins   Review of Systems Review of Systems  Constitutional: Negative for chills and fever.  HENT: Negative for sore throat, trouble swallowing and voice change.   Respiratory: Negative for shortness of breath, wheezing and stridor.   Gastrointestinal: Negative for nausea and vomiting.  Musculoskeletal: Negative for arthralgias and myalgias.  Skin: Positive for rash.     Physical Exam Triage Vital Signs ED Triage Vitals  Enc Vitals Group     BP 07/20/19 0914 129/88     Pulse Rate 07/20/19 0914 76     Resp 07/20/19 0914 17     Temp 07/20/19 0914 97.9 F (36.6 C)     Temp Source 07/20/19 0914 Oral     SpO2 07/20/19 0914 99 %     Weight 07/20/19 0914 151 lb (68.5 kg)     Height 07/20/19 0914 5\' 10"  (1.778 m)     Head Circumference --      Peak Flow --      Pain Score 07/20/19 0920 0     Pain Loc --      Pain Edu? --      Excl. in GC? --    No data found.  Updated Vital Signs BP 129/88 (BP Location: Right Arm)   Pulse 76   Temp 97.9 F (36.6 C) (Oral)    Resp 17   Ht 5\' 10"  (1.778 m)   Wt 151 lb (68.5 kg)   SpO2 99%   BMI 21.67 kg/m   Visual Acuity Right Eye Distance:   Left Eye Distance:   Bilateral Distance:    Right Eye Near:   Left Eye Near:    Bilateral Near:     Physical Exam Vitals signs and nursing note reviewed.  Constitutional:      Appearance: Normal appearance. He is well-developed.  HENT:     Head: Normocephalic and atraumatic.  Neck:     Musculoskeletal: Normal range of motion.  Cardiovascular:     Rate and Rhythm: Normal rate.  Pulmonary:     Effort: Pulmonary effort is normal.  Musculoskeletal: Normal range of motion.        General: No tenderness.  Skin:    General: Skin is warm and dry.     Findings: Erythema and rash present.     Comments: Diffuse erythematous wheals on both arms, smaller patches on anterior wrists and waist line.  Rash does blanch. Non-tender. No bleeding or drainage.  Rash worst in flexor aspect of both elbows.  Neurological:     Mental Status: He is alert and oriented to person, place, and time.  Psychiatric:        Behavior: Behavior normal.      UC Treatments / Results  Labs (all labs ordered are listed, but only abnormal results are displayed) Labs Reviewed - No data to display  EKG   Radiology No results found.  Procedures Procedures (including critical care time)  Medications Ordered in UC Medications - No data to display  Initial Impression / Assessment and Plan / UC Course  I have reviewed the triage vital signs and the nursing notes.  Pertinent labs & imaging results that were available during my care of the patient were reviewed by me and considered in my medical decision making (see chart for details).     Appearance of rash c/w allergic reaction w/o evidence of anaphylaxis at this time. No evidence of secondary bacterial infection at this time. Will tx with prednisone and triamcinolone Pt already taking benadryl, may continue or switch to  less/nondrowsy OTC clartin, zyrtec or allergra  Encouraged f/u with PCP this week if not improving, especially if worsening.  Final Clinical Impressions(s) / UC Diagnoses   Final diagnoses:  Rash and nonspecific skin eruption  Pruritic erythematous rash     Discharge Instructions      Please try the medication as prescribed. Follow up in 3-4 days if not  improving, sooner if significantly worsening.  Call 911 or have someone drive you to the hospital if you develop scratchy or sore throat, tongue or lip swelling, vomiting or difficulty breathing as these are signs of a severe allergic reaction.     ED Prescriptions    Medication Sig Dispense Auth. Provider   triamcinolone cream (KENALOG) 0.1 % Apply 1 application topically 2 (two) times daily. 30 g Doroteo Glassman, Arayah Krouse O, PA-C   predniSONE (DELTASONE) 50 MG tablet Take 1 tablet (50 mg total) by mouth daily with breakfast for 5 days. 5 tablet Lurene Shadow, PA-C     PDMP not reviewed this encounter.   Lurene Shadow, [place] 07/20/19 1113

## 2019-11-15 ENCOUNTER — Other Ambulatory Visit: Payer: Self-pay | Admitting: Family Medicine

## 2019-11-15 DIAGNOSIS — I10 Essential (primary) hypertension: Secondary | ICD-10-CM

## 2020-01-25 ENCOUNTER — Other Ambulatory Visit: Payer: Self-pay | Admitting: Family Medicine

## 2020-03-02 ENCOUNTER — Other Ambulatory Visit: Payer: Self-pay | Admitting: Family Medicine

## 2020-03-11 ENCOUNTER — Encounter: Payer: Self-pay | Admitting: Family Medicine

## 2020-03-11 ENCOUNTER — Ambulatory Visit (INDEPENDENT_AMBULATORY_CARE_PROVIDER_SITE_OTHER): Payer: 59 | Admitting: Family Medicine

## 2020-03-11 ENCOUNTER — Other Ambulatory Visit: Payer: Self-pay

## 2020-03-11 VITALS — BP 113/78 | HR 62 | Ht 70.0 in | Wt 154.0 lb

## 2020-03-11 DIAGNOSIS — I471 Supraventricular tachycardia, unspecified: Secondary | ICD-10-CM

## 2020-03-11 DIAGNOSIS — E7800 Pure hypercholesterolemia, unspecified: Secondary | ICD-10-CM | POA: Diagnosis not present

## 2020-03-11 DIAGNOSIS — I10 Essential (primary) hypertension: Secondary | ICD-10-CM

## 2020-03-11 DIAGNOSIS — Z125 Encounter for screening for malignant neoplasm of prostate: Secondary | ICD-10-CM

## 2020-03-11 MED ORDER — METOPROLOL SUCCINATE ER 50 MG PO TB24: 50.0000 mg | ORAL_TABLET | Freq: Every day | ORAL | 1 refills | Status: DC

## 2020-03-11 MED ORDER — ALPRAZOLAM 0.25 MG PO TABS: 0.2500 mg | ORAL_TABLET | Freq: Every day | ORAL | 0 refills | Status: DC | PRN

## 2020-03-11 MED ORDER — LISINOPRIL-HYDROCHLOROTHIAZIDE 10-12.5 MG PO TABS: 1.0000 | ORAL_TABLET | Freq: Every day | ORAL | 1 refills | Status: DC

## 2020-03-11 NOTE — Assessment & Plan Note (Signed)
Well controlled. Continue current regimen. Due to recheck lipids.

## 2020-03-11 NOTE — Assessment & Plan Note (Signed)
Re-estab with cardiology

## 2020-03-11 NOTE — Assessment & Plan Note (Signed)
Well controlled. Continue current regimen. Follow up in  6 mo  

## 2020-03-11 NOTE — Progress Notes (Addendum)
Established Patient Office Visit  Subjective:  Patient ID: Russell Spencer, male    DOB: 28-Sep-1966  Age: 54 y.o. MRN: [id number]  CC:  Chief Complaint  Patient presents with  . Hypertension    HPI Russell Spencer presents for follow-up hypertension.  He was actually recently seen in the emergency department on April 14 at [facility] for SVT.  At the time he had feelings of palpitations.  EMS attempted vagal maneuvers but it did not resolve the tachycardia.  Per report his heart rate was 190. Last time this happened was 2018.  He denies any recent changes.  He has a new low stress job.  He did asked to have a refill on his alprazolam which she has not used in a long time but since having the SVT episode he is just felt a little bit more anxious about it.  Past Medical History:  Diagnosis Date  . A-fib ([facility]) 12/28/2017  . Anxiety   . Paroxysmal SVT (supraventricular tachycardia) ([facility]) 02/04/2010   Qualifier: Diagnosis of  By: Linford Arnold MD, Santina Evans      History reviewed. No pertinent surgical history.  Family History  Problem Relation Age of Onset  . Diabetes Mother   . Hypertension Mother   . Hyperlipidemia Mother   . Kidney failure Mother        Dialysis    Social History   Socioeconomic History  . Marital status: Married    Spouse name: Not on file  . Number of children: 3  . Years of education: Not on file  . Highest education level: Not on file  Occupational History  . Occupation: Geographical information systems officer     Comment: [employer]   Tobacco Use  . Smoking status: Never Smoker  . Smokeless tobacco: Never Used  Substance and Sexual Activity  . Alcohol use: No  . Drug use: No  . Sexual activity: Yes    Partners: Female  Other Topics Concern  . Not on file  Social History Narrative   Some exercise, 5 x a week.     Social Determinants of Health   Financial Resource Strain:   . Difficulty of Paying Living Expenses:   Food Insecurity:   . Worried About Programme researcher, broadcasting/film/video in the  Last Year:   . Barista in the Last Year:   Transportation Needs:   . Freight forwarder (Medical):   Marland Kitchen Lack of Transportation (Non-Medical):   Physical Activity:   . Days of Exercise per Week:   . Minutes of Exercise per Session:   Stress:   . Feeling of Stress :   Social Connections:   . Frequency of Communication with Friends and Family:   . Frequency of Social Gatherings with Friends and Family:   . Attends Religious Services:   . Active Member of Clubs or Organizations:   . Attends Banker Meetings:   Marland Kitchen Marital Status:   Intimate Partner Violence:   . Fear of Current or Ex-Partner:   . Emotionally Abused:   Marland Kitchen Physically Abused:   . Sexually Abused:     Outpatient Medications Prior to Visit  Medication Sig Dispense Refill  . atorvastatin (LIPITOR) 20 MG tablet TAKE 1 TABLET (20 MG TOTAL) BY MOUTH AT BEDTIME. TAKE 1 TABLET AT BEDTIME 90 tablet 3  . clobetasol (OLUX) 0.05 % topical foam Apply topically 2 (two) times daily as needed. 50 g 5  . clobetasol cream (TEMOVATE) 0.05 % Apply 1  application topically 2 (two) times daily. 30 g 1  . triamcinolone cream (KENALOG) 0.1 % Apply 1 application topically 2 (two) times daily. 30 g 0  . lisinopril-hydrochlorothiazide (ZESTORETIC) 10-12.5 MG tablet TAKE 1 TABLET BY MOUTH EVERY DAY/ APPT/LABS FOR FURTHER REFILLS 90 tablet 0  . metoprolol succinate (TOPROL-XL) 50 MG 24 hr tablet Take 1 tablet (50 mg total) by mouth daily. 2 week supply given. Must schedule/keep appointment and get labs for refills 30 tablet 0   No facility-administered medications prior to visit.    Allergies  Allergen Reactions  . Penicillin G Other (See Comments)    .  Marland Kitchen Penicillins     REACTION: rash    ROS Review of Systems    Objective:    Physical Exam  Constitutional: He is oriented to person, place, and time. He appears well-developed and well-nourished.  HENT:  Head: Normocephalic and atraumatic.  Cardiovascular: Normal  rate, regular rhythm and normal heart sounds.  Pulmonary/Chest: Effort normal and breath sounds normal.  Neurological: He is alert and oriented to person, place, and time.  Skin: Skin is warm and dry.  Psychiatric: He has a normal mood and affect. His behavior is normal.    BP 113/78   Pulse 62   Ht 5\' 10"  (1.778 m)   Wt 154 lb (69.9 kg)   SpO2 100%   BMI 22.10 kg/m  Wt Readings from Last 3 Encounters:  03/11/20 154 lb (69.9 kg)  07/20/19 151 lb (68.5 kg)  05/22/19 153 lb (69.4 kg)     There are no preventive care reminders to display for this patient.  There are no preventive care reminders to display for this patient.  Lab Results  Component Value Date   TSH 1.876 04/30/2014   Lab Results  Component Value Date   WBC 4.0 04/13/2018   HGB 13.8 04/13/2018   HCT 41.5 04/13/2018   MCV 90.4 04/13/2018   PLT 258 04/13/2018   Lab Results  Component Value Date   NA 138 04/13/2018   K 4.0 04/13/2018   CO2 30 04/13/2018   GLUCOSE 96 04/13/2018   BUN 18 04/13/2018   CREATININE 1.14 04/13/2018   BILITOT 0.9 04/13/2018   ALKPHOS 75 09/19/2016   AST 18 04/13/2018   ALT 28 04/13/2018   PROT 7.1 04/13/2018   ALBUMIN 5.1 09/19/2016   CALCIUM 10.0 04/13/2018   Lab Results  Component Value Date   CHOL 204 (H) 04/13/2018   Lab Results  Component Value Date   HDL 47 04/13/2018   Lab Results  Component Value Date   LDLCALC 136 (H) 04/13/2018   Lab Results  Component Value Date   TRIG 107 04/13/2018   Lab Results  Component Value Date   CHOLHDL 4.3 04/13/2018   Lab Results  Component Value Date   HGBA1C 4.9 04/13/2018      Assessment & Plan:   Problem List Items Addressed This Visit      Cardiovascular and Mediastinum   Paroxysmal SVT (supraventricular tachycardia) ([place])    Re-estab with cardiology      Relevant Medications   lisinopril-hydrochlorothiazide (ZESTORETIC) 10-12.5 MG tablet   metoprolol succinate (TOPROL-XL) 50 MG 24 hr tablet   Other  Relevant Orders   Ambulatory referral to Cardiology   Essential hypertension - Primary    Well controlled. Continue current regimen. Follow up in  6 mo.       Relevant Medications   lisinopril-hydrochlorothiazide (ZESTORETIC) 10-12.5 MG tablet   metoprolol succinate (  TOPROL-XL) 50 MG 24 hr tablet   Other Relevant Orders   CBC   COMPLETE METABOLIC PANEL WITH GFR   Lipid panel     Other   Hyperlipidemia    Well controlled. Continue current regimen. Due to recheck lipids.       Relevant Medications   lisinopril-hydrochlorothiazide (ZESTORETIC) 10-12.5 MG tablet   metoprolol succinate (TOPROL-XL) 50 MG 24 hr tablet   Other Relevant Orders   CBC   COMPLETE METABOLIC PANEL WITH GFR   Lipid panel    Other Visit Diagnoses    Screening PSA (prostate specific antigen)       Relevant Orders   CBC   COMPLETE METABOLIC PANEL WITH GFR   Lipid panel   PSA      Meds ordered this encounter  Medications  . lisinopril-hydrochlorothiazide (ZESTORETIC) 10-12.5 MG tablet    Sig: Take 1 tablet by mouth daily.    Dispense:  90 tablet    Refill:  1  . metoprolol succinate (TOPROL-XL) 50 MG 24 hr tablet    Sig: Take 1 tablet (50 mg total) by mouth daily.    Dispense:  90 tablet    Refill:  1  . ALPRAZolam (XANAX) 0.25 MG tablet    Sig: Take 1 tablet (0.25 mg total) by mouth daily as needed.    Dispense:  10 tablet    Refill:  0    This request is for a new prescription for a controlled substance as required by Federal/State law.    Follow-up: Return in about 6 months (around 09/11/2020) for Hypertension.    Nani Gasser, MD

## 2020-03-11 NOTE — Addendum Note (Signed)
Addended by: Nani Gasser D on: 03/11/2020 02:19 PM   Modules accepted: Orders

## 2020-07-02 ENCOUNTER — Other Ambulatory Visit: Payer: Self-pay | Admitting: Family Medicine

## 2020-08-11 ENCOUNTER — Other Ambulatory Visit: Payer: Self-pay | Admitting: Family Medicine

## 2020-08-11 DIAGNOSIS — I10 Essential (primary) hypertension: Secondary | ICD-10-CM

## 2020-08-11 IMAGING — DX CERVICAL SPINE - COMPLETE 4+ VIEW
5 series · 5 of 5 positions shown · non-contrast
Comparison: None.

CLINICAL DATA: Radiculitis of the right cervical region.

EXAM:
CERVICAL SPINE - COMPLETE 4+ VIEW

[c-spine lat]
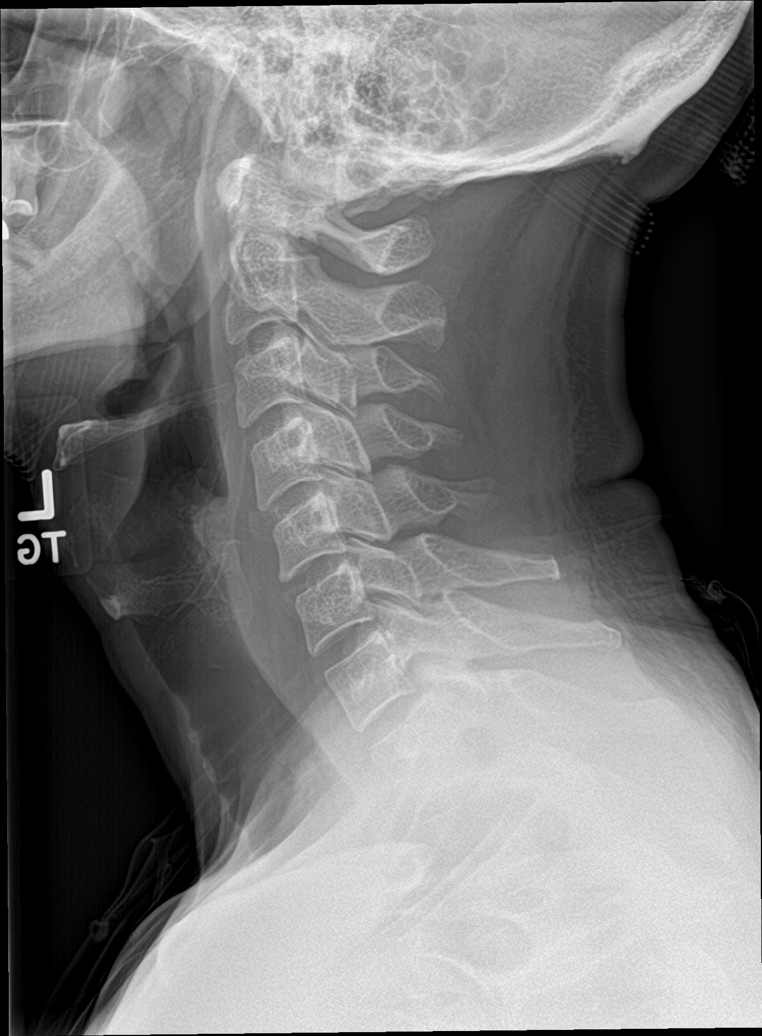

[c-spine obl (1 of 2)]
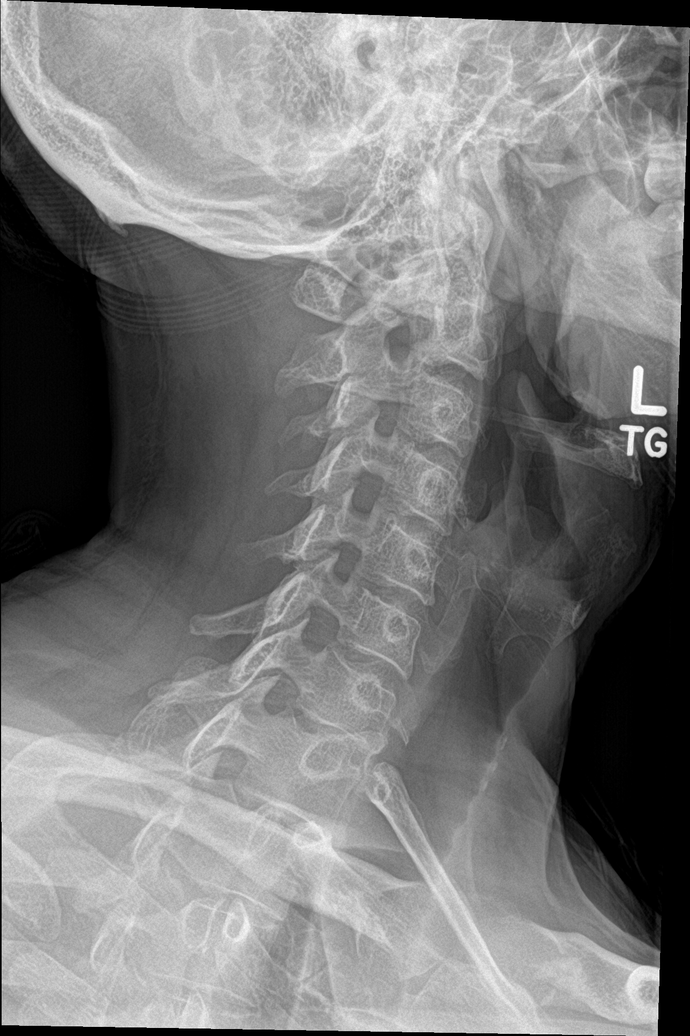

[c-spine obl (2 of 2)]
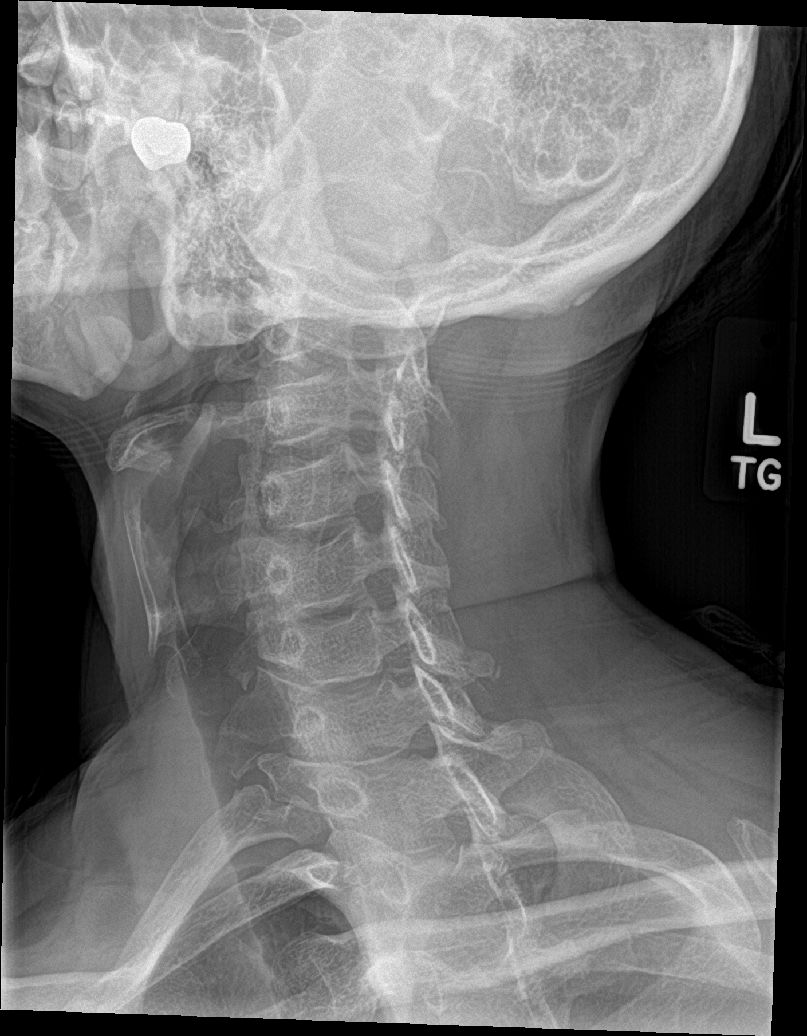

[c-spine ap]
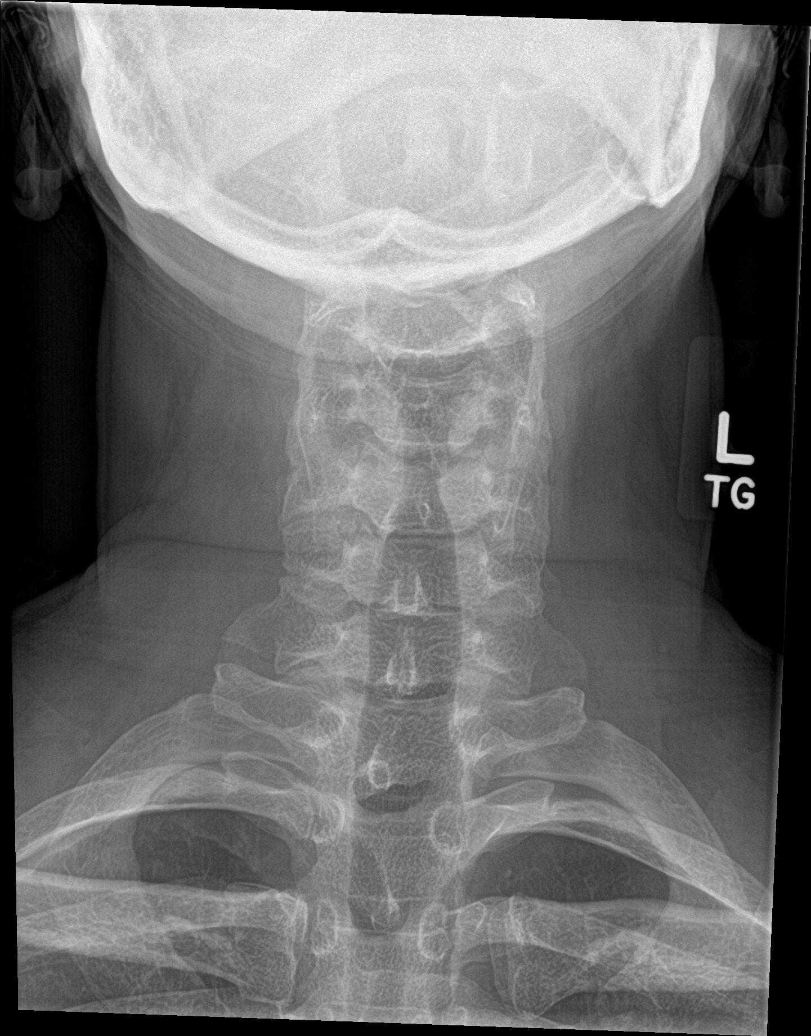

[c-spine open mouth]
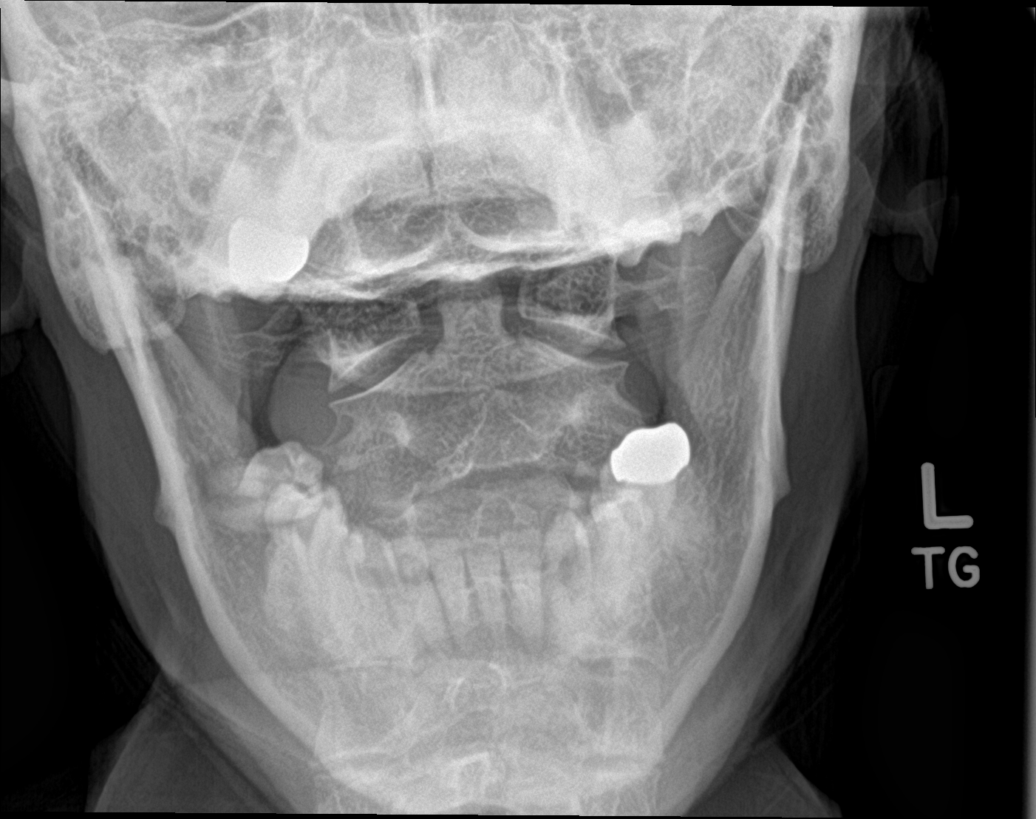

[5 of 5 positions shown; findings below may reference images not displayed]

FINDINGS: There is no evidence of cervical spine fracture or prevertebral soft
tissue swelling. Alignment is normal. No other significant bone
abnormalities are identified.
IMPRESSION: Negative cervical spine radiographs.

## 2020-10-13 ENCOUNTER — Other Ambulatory Visit: Payer: Self-pay | Admitting: Family Medicine

## 2020-10-13 DIAGNOSIS — I10 Essential (primary) hypertension: Secondary | ICD-10-CM

## 2020-10-25 ENCOUNTER — Other Ambulatory Visit: Payer: Self-pay | Admitting: Family Medicine

## 2020-10-25 DIAGNOSIS — I10 Essential (primary) hypertension: Secondary | ICD-10-CM

## 2020-10-26 NOTE — Telephone Encounter (Signed)
LVM for patient to call back to get this appt scheduled. AM 

## 2020-10-26 NOTE — Telephone Encounter (Signed)
Please call pt and advise him that he is overdue for f/u for bp and labs.

## 2020-11-09 ENCOUNTER — Other Ambulatory Visit: Payer: Self-pay | Admitting: Family Medicine

## 2020-11-09 DIAGNOSIS — I10 Essential (primary) hypertension: Secondary | ICD-10-CM

## 2020-11-09 NOTE — Telephone Encounter (Signed)
Please call pt for f/u on bp medication and labs.

## 2020-11-10 NOTE — Telephone Encounter (Signed)
Appt has been scheduled for 11/18/20. AM

## 2020-11-18 ENCOUNTER — Ambulatory Visit: Payer: 59 | Admitting: Family Medicine

## 2020-11-23 ENCOUNTER — Other Ambulatory Visit: Payer: Self-pay | Admitting: Family Medicine

## 2020-11-23 DIAGNOSIS — I10 Essential (primary) hypertension: Secondary | ICD-10-CM

## 2020-11-27 ENCOUNTER — Other Ambulatory Visit: Payer: Self-pay | Admitting: Family Medicine

## 2020-11-27 DIAGNOSIS — I10 Essential (primary) hypertension: Secondary | ICD-10-CM

## 2020-12-01 ENCOUNTER — Ambulatory Visit: Payer: 59 | Admitting: Family Medicine

## 2020-12-02 ENCOUNTER — Other Ambulatory Visit: Payer: Self-pay | Admitting: Family Medicine

## 2020-12-02 DIAGNOSIS — I10 Essential (primary) hypertension: Secondary | ICD-10-CM

## 2020-12-09 ENCOUNTER — Encounter: Payer: Self-pay | Admitting: Family Medicine

## 2020-12-09 ENCOUNTER — Other Ambulatory Visit: Payer: Self-pay

## 2020-12-09 ENCOUNTER — Ambulatory Visit (INDEPENDENT_AMBULATORY_CARE_PROVIDER_SITE_OTHER): Payer: 59 | Admitting: Family Medicine

## 2020-12-09 VITALS — BP 114/67 | HR 61 | Ht 70.0 in | Wt 155.0 lb

## 2020-12-09 DIAGNOSIS — Z125 Encounter for screening for malignant neoplasm of prostate: Secondary | ICD-10-CM

## 2020-12-09 DIAGNOSIS — I471 Supraventricular tachycardia: Secondary | ICD-10-CM | POA: Diagnosis not present

## 2020-12-09 DIAGNOSIS — L301 Dyshidrosis [pompholyx]: Secondary | ICD-10-CM

## 2020-12-09 DIAGNOSIS — E7800 Pure hypercholesterolemia, unspecified: Secondary | ICD-10-CM | POA: Diagnosis not present

## 2020-12-09 DIAGNOSIS — I10 Essential (primary) hypertension: Secondary | ICD-10-CM

## 2020-12-09 DIAGNOSIS — Z1159 Encounter for screening for other viral diseases: Secondary | ICD-10-CM

## 2020-12-09 DIAGNOSIS — F411 Generalized anxiety disorder: Secondary | ICD-10-CM

## 2020-12-09 MED ORDER — CLOBETASOL PROPIONATE 0.05 % EX FOAM
Freq: Two times a day (BID) | CUTANEOUS | 5 refills | Status: DC | PRN
Start: 2020-12-09 — End: 2024-09-09

## 2020-12-09 MED ORDER — ALPRAZOLAM 0.25 MG PO TABS: 0.2500 mg | ORAL_TABLET | Freq: Every day | ORAL | 0 refills | Status: DC | PRN

## 2020-12-09 MED ORDER — CLOBETASOL PROPIONATE 0.05 % EX CREA: 1.0000 "application " | TOPICAL_CREAM | Freq: Two times a day (BID) | CUTANEOUS | 1 refills | Status: AC

## 2020-12-09 MED ORDER — LISINOPRIL-HYDROCHLOROTHIAZIDE 10-12.5 MG PO TABS: 1.0000 | ORAL_TABLET | Freq: Every day | ORAL | 1 refills | Status: DC

## 2020-12-09 MED ORDER — METOPROLOL SUCCINATE ER 50 MG PO TB24
ORAL_TABLET | ORAL | 1 refills | Status: DC
Start: 2020-12-09 — End: 2021-06-23

## 2020-12-09 NOTE — Assessment & Plan Note (Signed)
Stable. On BB

## 2020-12-09 NOTE — Progress Notes (Signed)
Established Patient Office Visit  Subjective:  Patient ID: Russell Spencer, male    DOB: 1966/03/05  Age: 55 y.o. MRN: [id number]  CC:  Chief Complaint  Patient presents with  . Hypertension    HPI Russell Spencer presents for   Hypertension- Pt denies chest pain, SOB, dizziness, or heart palpitations.  Taking meds as directed w/o problems.  Denies medication side effects.    Hyperlipidemia - tolerating stating well with no myalgias or significant side effects.  Lab Results  Component Value Date   CHOL 204 (H) 04/13/2018   HDL 47 04/13/2018   LDLCALC 136 (H) 04/13/2018   TRIG 107 04/13/2018   CHOLHDL 4.3 04/13/2018      Past Medical History:  Diagnosis Date  . A-fib ([facility]) 12/28/2017  . Anxiety   . Paroxysmal SVT (supraventricular tachycardia) ([facility]) 02/04/2010   Qualifier: Diagnosis of  By: Linford Arnold MD, Santina Evans      History reviewed. No pertinent surgical history.  Family History  Problem Relation Age of Onset  . Diabetes Mother   . Hypertension Mother   . Hyperlipidemia Mother   . Kidney failure Mother        Dialysis    Social History   Socioeconomic History  . Marital status: Married    Spouse name: Not on file  . Number of children: 3  . Years of education: Not on file  . Highest education level: Not on file  Occupational History  . Occupation: Geographical information systems officer     Comment: [employer]   Tobacco Use  . Smoking status: Never Smoker  . Smokeless tobacco: Never Used  Substance and Sexual Activity  . Alcohol use: No  . Drug use: No  . Sexual activity: Yes    Partners: Female  Other Topics Concern  . Not on file  Social History Narrative   Some exercise, 5 x a week.     Social Determinants of Health   Financial Resource Strain: Not on file  Food Insecurity: Not on file  Transportation Needs: Not on file  Physical Activity: Not on file  Stress: Not on file  Social Connections: Not on file  Intimate Partner Violence: Not on file    Outpatient  Medications Prior to Visit  Medication Sig Dispense Refill  . atorvastatin (LIPITOR) 20 MG tablet TAKE 1 TABLET (20 MG TOTAL) BY MOUTH AT BEDTIME. TAKE 1 TABLET AT BEDTIME 90 tablet 3  . ALPRAZolam (XANAX) 0.25 MG tablet Take 1 tablet (0.25 mg total) by mouth daily as needed. 10 tablet 0  . clobetasol (OLUX) 0.05 % topical foam Apply topically 2 (two) times daily as needed. 50 g 5  . clobetasol cream (TEMOVATE) 0.05 % Apply 1 application topically 2 (two) times daily. 30 g 1  . lisinopril-hydrochlorothiazide (ZESTORETIC) 10-12.5 MG tablet Take 1 tablet by mouth daily. 2 WEEK SUPPLY GIVEN.PATIENT WILL NEED AN APPOINTMENT/LABS FOR REFILLS 15 tablet 0  . metoprolol succinate (TOPROL-XL) 50 MG 24 hr tablet TAKE 1 TABLET BY MOUTH EVERY DAY- NEEDS APPOINTMENT 7 tablet 0  . triamcinolone cream (KENALOG) 0.1 % Apply 1 application topically 2 (two) times daily. 30 g 0   No facility-administered medications prior to visit.    Allergies  Allergen Reactions  . Penicillin G Other (See Comments)    .  Marland Kitchen Penicillins     REACTION: rash    ROS Review of Systems    Objective:    Physical Exam Constitutional:      Appearance: He is  well-developed and well-nourished.  HENT:     Head: Normocephalic and atraumatic.  Cardiovascular:     Rate and Rhythm: Normal rate and regular rhythm.     Heart sounds: Normal heart sounds.  Pulmonary:     Effort: Pulmonary effort is normal.     Breath sounds: Normal breath sounds.  Skin:    General: Skin is warm and dry.  Neurological:     Mental Status: He is alert and oriented to person, place, and time.  Psychiatric:        Mood and Affect: Mood and affect normal.        Behavior: Behavior normal.     BP 130/80   Pulse 63   Ht 5\' 10"  (1.778 m)   Wt 155 lb (70.3 kg)   SpO2 100%   BMI 22.24 kg/m  Wt Readings from Last 3 Encounters:  12/09/20 155 lb (70.3 kg)  03/11/20 154 lb (69.9 kg)  07/20/19 151 lb (68.5 kg)     Health Maintenance Due   Topic Date Due  . Hepatitis C Screening  Never done    There are no preventive care reminders to display for this patient.  Lab Results  Component Value Date   TSH 1.876 04/30/2014   Lab Results  Component Value Date   WBC 4.0 04/13/2018   HGB 13.8 04/13/2018   HCT 41.5 04/13/2018   MCV 90.4 04/13/2018   PLT 258 04/13/2018   Lab Results  Component Value Date   NA 138 04/13/2018   K 4.0 04/13/2018   CO2 30 04/13/2018   GLUCOSE 96 04/13/2018   BUN 18 04/13/2018   CREATININE 1.14 04/13/2018   BILITOT 0.9 04/13/2018   ALKPHOS 75 09/19/2016   AST 18 04/13/2018   ALT 28 04/13/2018   PROT 7.1 04/13/2018   ALBUMIN 5.1 09/19/2016   CALCIUM 10.0 04/13/2018   Lab Results  Component Value Date   CHOL 204 (H) 04/13/2018   Lab Results  Component Value Date   HDL 47 04/13/2018   Lab Results  Component Value Date   LDLCALC 136 (H) 04/13/2018   Lab Results  Component Value Date   TRIG 107 04/13/2018   Lab Results  Component Value Date   CHOLHDL 4.3 04/13/2018   Lab Results  Component Value Date   HGBA1C 4.9 04/13/2018      Assessment & Plan:   Problem List Items Addressed This Visit      Cardiovascular and Mediastinum   Paroxysmal SVT (supraventricular tachycardia) ([facility])    Stable. On BB      Relevant Medications   metoprolol succinate (TOPROL-XL) 50 MG 24 hr tablet   lisinopril-hydrochlorothiazide (ZESTORETIC) 10-12.5 MG tablet   Other Relevant Orders   CBC   COMPLETE METABOLIC PANEL WITH GFR   Lipid panel   PSA   Essential hypertension - Primary    Well controlled. Continue current regimen. Follow up in  6 mo due for labs.       Relevant Medications   metoprolol succinate (TOPROL-XL) 50 MG 24 hr tablet   lisinopril-hydrochlorothiazide (ZESTORETIC) 10-12.5 MG tablet   Other Relevant Orders   CBC   COMPLETE METABOLIC PANEL WITH GFR   Lipid panel   PSA     Musculoskeletal and Integument   DYSHIDROTIC ECZEMA    RF steroid creams       Relevant Medications   clobetasol (OLUX) 0.05 % topical foam   clobetasol cream (TEMOVATE) 0.05 %     Other  Hyperlipidemia    Tolerating statin well. Due for labs.  F/U in 1 yr      Relevant Medications   metoprolol succinate (TOPROL-XL) 50 MG 24 hr tablet   lisinopril-hydrochlorothiazide (ZESTORETIC) 10-12.5 MG tablet   Other Relevant Orders   Lipid panel   ANXIETY DISORDER, GENERALIZED    RF small quantity of xanax      Relevant Medications   ALPRAZolam (XANAX) 0.25 MG tablet    Other Visit Diagnoses    Screening PSA (prostate specific antigen)       Relevant Orders   PSA   Need for hepatitis C screening test       Relevant Orders   Hepatitis C antibody      Meds ordered this encounter  Medications  . metoprolol succinate (TOPROL-XL) 50 MG 24 hr tablet    Sig: TAKE 1 TABLET BY MOUTH EVERY DAY    Dispense:  90 tablet    Refill:  1  . lisinopril-hydrochlorothiazide (ZESTORETIC) 10-12.5 MG tablet    Sig: Take 1 tablet by mouth daily.    Dispense:  90 tablet    Refill:  1  . ALPRAZolam (XANAX) 0.25 MG tablet    Sig: Take 1 tablet (0.25 mg total) by mouth daily as needed.    Dispense:  10 tablet    Refill:  0    This request is for a new prescription for a controlled substance as required by Federal/State law.  . clobetasol (OLUX) 0.05 % topical foam    Sig: Apply topically 2 (two) times daily as needed.    Dispense:  50 g    Refill:  5  . clobetasol cream (TEMOVATE) 0.05 %    Sig: Apply 1 application topically 2 (two) times daily.    Dispense:  30 g    Refill:  1    Follow-up: Return in about 6 months (around 06/08/2021) for Hypertension.    Nani Gasser, MD

## 2020-12-09 NOTE — Assessment & Plan Note (Signed)
RF small quantity of xanax

## 2020-12-09 NOTE — Assessment & Plan Note (Signed)
Tolerating statin well. Due for labs.  F/U in 1 yr

## 2020-12-09 NOTE — Assessment & Plan Note (Signed)
RF steroid creams

## 2020-12-09 NOTE — Assessment & Plan Note (Signed)
Well controlled. Continue current regimen. Follow up in  6 mo due for labs.  

## 2020-12-10 LAB — LIPID PANEL
Cholesterol: 180 mg/dL (ref <200)
HDL: 50 mg/dL (ref >=40)
LDL Cholesterol (Calc): 114 mg/dL (calc) — ABNORMAL HIGH
Non-HDL Cholesterol (Calc): 130 mg/dL (calc) — ABNORMAL HIGH (ref <130)
Total CHOL/HDL Ratio: 3.6 (calc) (ref <5.0)
Triglycerides: 70 mg/dL (ref <150)

## 2020-12-10 LAB — CBC
HCT: 44.9 % (ref 38.5–50.0)
Hemoglobin: 15.1 g/dL (ref 13.2–17.1)
MCH: 31.2 pg (ref 27.0–33.0)
MCHC: 33.6 g/dL (ref 32.0–36.0)
MCV: 92.8 fL (ref 80.0–100.0)
MPV: 10.4 fL (ref 7.5–12.5)
Platelets: 266 10*3/uL (ref 140–400)
RBC: 4.84 10*6/uL (ref 4.20–5.80)
RDW: 11.4 % (ref 11.0–15.0)
WBC: 3 10*3/uL — ABNORMAL LOW (ref 3.8–10.8)

## 2020-12-10 LAB — COMPLETE METABOLIC PANEL WITH GFR
AG Ratio: 1.9 (calc) (ref 1.0–2.5)
ALT: 31 U/L (ref 9–46)
AST: 21 U/L (ref 10–35)
Albumin: 4.8 g/dL (ref 3.6–5.1)
Alkaline phosphatase (APISO): 58 U/L (ref 35–144)
BUN: 15 mg/dL (ref 7–25)
CO2: 33 mmol/L — ABNORMAL HIGH (ref 20–32)
Calcium: 10.5 mg/dL — ABNORMAL HIGH (ref 8.6–10.3)
Chloride: 100 mmol/L (ref 98–110)
Creat: 1.04 mg/dL (ref 0.70–1.33)
GFR, Est African American: 94 mL/min/{1.73_m2} (ref >=60)
GFR, Est Non African American: 81 mL/min/{1.73_m2} (ref >=60)
Globulin: 2.5 g/dL (calc) (ref 1.9–3.7)
Glucose, Bld: 100 mg/dL — ABNORMAL HIGH (ref 65–99)
Potassium: 5.3 mmol/L (ref 3.5–5.3)
Sodium: 138 mmol/L (ref 135–146)
Total Bilirubin: 1 mg/dL (ref 0.2–1.2)
Total Protein: 7.3 g/dL (ref 6.1–8.1)

## 2020-12-10 LAB — HEPATITIS C ANTIBODY
Hepatitis C Ab: NONREACTIVE
SIGNAL TO CUT-OFF: 0.02 (ref <1.00)

## 2020-12-10 LAB — PSA: PSA: 1.37 ng/mL (ref <=4.0)

## 2020-12-22 ENCOUNTER — Telehealth: Payer: Self-pay

## 2020-12-22 NOTE — Telephone Encounter (Signed)
Russell Spencer called and asked if Dr Linford Arnold has been able to get the forms for internship for his daughter Russell Spencer.

## 2021-01-04 NOTE — Telephone Encounter (Signed)
Please tell him I did not forget about him I know it has been the most 2 weeks since he called.  I was trying to get in touch with Toniann Fail about the paperwork and I finally got a hold of her today.  So what he will need to do is just have his daughter email Horatio Pel directly about a request to come into our office to shadow.  She will get her the appropriate paperwork via email to complete and then we can get her set up.  Again apologize for the delay in getting back with her.

## 2021-01-04 NOTE — Telephone Encounter (Signed)
Left VM with instructions.

## 2021-06-03 ENCOUNTER — Other Ambulatory Visit: Payer: Self-pay

## 2021-06-03 ENCOUNTER — Ambulatory Visit (INDEPENDENT_AMBULATORY_CARE_PROVIDER_SITE_OTHER): Payer: BC Managed Care – PPO | Admitting: Family Medicine

## 2021-06-03 ENCOUNTER — Encounter: Payer: Self-pay | Admitting: Family Medicine

## 2021-06-03 VITALS — BP 116/70 | HR 62 | Temp 98.8°F | Ht 70.0 in | Wt 160.0 lb

## 2021-06-03 DIAGNOSIS — I10 Essential (primary) hypertension: Secondary | ICD-10-CM | POA: Diagnosis not present

## 2021-06-03 DIAGNOSIS — E7800 Pure hypercholesterolemia, unspecified: Secondary | ICD-10-CM

## 2021-06-03 DIAGNOSIS — I471 Supraventricular tachycardia: Secondary | ICD-10-CM

## 2021-06-03 DIAGNOSIS — F411 Generalized anxiety disorder: Secondary | ICD-10-CM | POA: Diagnosis not present

## 2021-06-03 MED ORDER — LISINOPRIL 10 MG PO TABS: 10.0000 mg | ORAL_TABLET | Freq: Every day | ORAL | 1 refills | Status: DC

## 2021-06-03 NOTE — Assessment & Plan Note (Signed)
We did discuss based on last LDL potential need for increasing his statin.  He really wants to focus on improving his diet over the next several months we can always recheck in lipid panel in 3 to 6 months.    Lipid Panel     Component Value Date/Time   CHOL 180 12/09/2020 0859   TRIG 70 12/09/2020 0859   HDL 50 12/09/2020 0859   CHOLHDL 3.6 12/09/2020 0859   VLDL 18 09/19/2016 0919   LDLCALC 114 (H) 12/09/2020 0859

## 2021-06-03 NOTE — Assessment & Plan Note (Signed)
Blood pressure looks beautiful and back to usual he says it is in the low teens at home he would like to see if we could decrease his medication a little bit.  We will discontinue the HCTZ component of his lisinopril.  Discussed that the goal is to keep his systolic under 130.  Continue metoprolol for his history is as SVT.  Follow-up in 6 months

## 2021-06-03 NOTE — Progress Notes (Signed)
Established Patient Office Visit  Subjective:  Patient ID: Russell Spencer, male    DOB: 02/24/1966  Age: 55 y.o. MRN: [id number]  CC:  Chief Complaint  Patient presents with   Hypertension    HPI Russell Spencer presents for   Hypertension- Pt denies chest pain, SOB, dizziness, or heart palpitations.  Taking meds as directed w/o problems.  Denies medication side effects.    F/U GAD -overall he is doing well not currently on chronic medication has not used the Xanax in years but does have it on hand just in case.  Follow-up hyperlipidemia-last LDL not quite at goal.  But tolerating the statin well although he admits his diet has not been great he is pretty much just been eating what he wants and knows he needs to get back on track  Past Medical History:  Diagnosis Date   A-fib (HCC) 12/28/2017   Anxiety    Paroxysmal SVT (supraventricular tachycardia) (HCC) 02/04/2010   Qualifier: Diagnosis of  By: Linford Arnold MD, Santina Evans      History reviewed. No pertinent surgical history.  Family History  Problem Relation Age of Onset   Diabetes Mother    Hypertension Mother    Hyperlipidemia Mother    Kidney failure Mother        Dialysis    Social History   Socioeconomic History   Marital status: Married    Spouse name: Not on file   Number of children: 3   Years of education: Not on file   Highest education level: Not on file  Occupational History   Occupation: Geographical information systems officer     Comment: [employer]   Tobacco Use   Smoking status: Never   Smokeless tobacco: Never  Substance and Sexual Activity   Alcohol use: No   Drug use: No   Sexual activity: Yes    Partners: Female  Other Topics Concern   Not on file  Social History Narrative   Some exercise, 5 x a week.     Social Determinants of Health   Financial Resource Strain: Not on file  Food Insecurity: Not on file  Transportation Needs: Not on file  Physical Activity: Not on file  Stress: Not on file  Social Connections:  Not on file  Intimate Partner Violence: Not on file    Outpatient Medications Prior to Visit  Medication Sig Dispense Refill   ALPRAZolam (XANAX) 0.25 MG tablet Take 1 tablet (0.25 mg total) by mouth daily as needed. 10 tablet 0   atorvastatin (LIPITOR) 20 MG tablet TAKE 1 TABLET (20 MG TOTAL) BY MOUTH AT BEDTIME. TAKE 1 TABLET AT BEDTIME 90 tablet 3   clobetasol (OLUX) 0.05 % topical foam Apply topically 2 (two) times daily as needed. 50 g 5   clobetasol cream (TEMOVATE) 0.05 % Apply 1 application topically 2 (two) times daily. 30 g 1   metoprolol succinate (TOPROL-XL) 50 MG 24 hr tablet TAKE 1 TABLET BY MOUTH EVERY DAY 90 tablet 1   lisinopril-hydrochlorothiazide (ZESTORETIC) 10-12.5 MG tablet Take 1 tablet by mouth daily. 90 tablet 1   No facility-administered medications prior to visit.    Allergies  Allergen Reactions   Penicillin G Other (See Comments)    .   Penicillins     REACTION: rash    ROS Review of Systems    Objective:    Physical Exam Constitutional:      Appearance: Normal appearance. He is well-developed.  HENT:     Head: Normocephalic and  atraumatic.     Left Ear: External ear normal.     Mouth/Throat:     Pharynx: No posterior oropharyngeal erythema.  Eyes:     Conjunctiva/sclera: Conjunctivae normal.     Pupils: Pupils are equal, round, and reactive to light.  Neck:     Thyroid: No thyromegaly.  Cardiovascular:     Rate and Rhythm: Normal rate and regular rhythm.     Heart sounds: Normal heart sounds.  Pulmonary:     Effort: Pulmonary effort is normal.     Breath sounds: Normal breath sounds.  Musculoskeletal:     Cervical back: Neck supple.  Lymphadenopathy:     Cervical: No cervical adenopathy.  Skin:    General: Skin is warm and dry.  Neurological:     Mental Status: He is alert and oriented to person, place, and time. Mental status is at baseline.  Psychiatric:        Mood and Affect: Mood normal.        Behavior: Behavior normal.    BP 116/70   Pulse 62   Temp 98.8 F (37.1 C) (Oral)   Ht 5\' 10"  (1.778 m)   Wt 160 lb (72.6 kg)   SpO2 99% Comment: on RA  BMI 22.96 kg/m  Wt Readings from Last 3 Encounters:  06/03/21 160 lb (72.6 kg)  12/09/20 155 lb (70.3 kg)  03/11/20 154 lb (69.9 kg)     Health Maintenance Due  Topic Date Due   Zoster Vaccines- Shingrix (1 of 2) Never done   COVID-19 Vaccine (3 - Booster for Moderna series) 05/27/2020   INFLUENZA VACCINE  05/31/2021    There are no preventive care reminders to display for this patient.  Lab Results  Component Value Date   TSH 1.876 04/30/2014   Lab Results  Component Value Date   WBC 3.0 (L) 12/09/2020   HGB 15.1 12/09/2020   HCT 44.9 12/09/2020   MCV 92.8 12/09/2020   PLT 266 12/09/2020   Lab Results  Component Value Date   NA 138 12/09/2020   K 5.3 12/09/2020   CO2 33 (H) 12/09/2020   GLUCOSE 100 (H) 12/09/2020   BUN 15 12/09/2020   CREATININE 1.04 12/09/2020   BILITOT 1.0 12/09/2020   ALKPHOS 75 09/19/2016   AST 21 12/09/2020   ALT 31 12/09/2020   PROT 7.3 12/09/2020   ALBUMIN 5.1 09/19/2016   CALCIUM 10.5 (H) 12/09/2020   Lab Results  Component Value Date   CHOL 180 12/09/2020   Lab Results  Component Value Date   HDL 50 12/09/2020   Lab Results  Component Value Date   LDLCALC 114 (H) 12/09/2020   Lab Results  Component Value Date   TRIG 70 12/09/2020   Lab Results  Component Value Date   CHOLHDL 3.6 12/09/2020   Lab Results  Component Value Date   HGBA1C 4.9 04/13/2018      Assessment & Plan:   Problem List Items Addressed This Visit       Cardiovascular and Mediastinum   Paroxysmal SVT (supraventricular tachycardia) (HCC)    Continue metoprolol 50 mg.       Relevant Medications   lisinopril (ZESTRIL) 10 MG tablet   Essential hypertension - Primary    Blood pressure looks beautiful and back to usual he says it is in the low teens at home he would like to see if we could decrease his medication  a little bit.  We will discontinue the HCTZ component of  his lisinopril.  Discussed that the goal is to keep his systolic under 130.  Continue metoprolol for his history is as SVT.  Follow-up in 6 months       Relevant Medications   lisinopril (ZESTRIL) 10 MG tablet     Other   Hyperlipidemia    We did discuss based on last LDL potential need for increasing his statin.  He really wants to focus on improving his diet over the next several months we can always recheck in lipid panel in 3 to 6 months.    Lipid Panel     Component Value Date/Time   CHOL 180 12/09/2020 0859   TRIG 70 12/09/2020 0859   HDL 50 12/09/2020 0859   CHOLHDL 3.6 12/09/2020 0859   VLDL 18 09/19/2016 0919   LDLCALC 114 (H) 12/09/2020 0859         Relevant Medications   lisinopril (ZESTRIL) 10 MG tablet   Other Relevant Orders   Lipid Panel w/reflex Direct LDL   ANXIETY DISORDER, GENERALIZED    Doing really well         Meds ordered this encounter  Medications   lisinopril (ZESTRIL) 10 MG tablet    Sig: Take 1 tablet (10 mg total) by mouth daily.    Dispense:  90 tablet    Refill:  1    Follow-up: Return in about 6 months (around 12/04/2021) for Hypertension.    Nani Gasser, MD

## 2021-06-03 NOTE — Assessment & Plan Note (Signed)
Doing really well.

## 2021-06-03 NOTE — Assessment & Plan Note (Signed)
Continue metoprolol 50 mg.

## 2021-06-07 ENCOUNTER — Other Ambulatory Visit: Payer: Self-pay | Admitting: Family Medicine

## 2021-06-07 DIAGNOSIS — I471 Supraventricular tachycardia: Secondary | ICD-10-CM

## 2021-06-07 DIAGNOSIS — I10 Essential (primary) hypertension: Secondary | ICD-10-CM

## 2021-06-08 ENCOUNTER — Ambulatory Visit: Payer: BC Managed Care – PPO | Admitting: Family Medicine

## 2021-06-21 ENCOUNTER — Other Ambulatory Visit: Payer: Self-pay | Admitting: Family Medicine

## 2021-06-21 DIAGNOSIS — I471 Supraventricular tachycardia: Secondary | ICD-10-CM

## 2021-06-21 DIAGNOSIS — I10 Essential (primary) hypertension: Secondary | ICD-10-CM

## 2021-08-26 ENCOUNTER — Other Ambulatory Visit: Payer: Self-pay

## 2021-08-26 ENCOUNTER — Ambulatory Visit (INDEPENDENT_AMBULATORY_CARE_PROVIDER_SITE_OTHER): Payer: BC Managed Care – PPO | Admitting: Family Medicine

## 2021-08-26 VITALS — BP 131/84 | HR 76 | Ht 70.0 in | Wt 164.0 lb

## 2021-08-26 DIAGNOSIS — L989 Disorder of the skin and subcutaneous tissue, unspecified: Secondary | ICD-10-CM

## 2021-08-26 MED ORDER — TRIAMCINOLONE ACETONIDE 0.1 % EX CREA: 1.0000 "application " | TOPICAL_CREAM | Freq: Every day | CUTANEOUS | 0 refills | Status: AC | PRN

## 2021-08-26 NOTE — Progress Notes (Signed)
Acute Office Visit  Subjective:    Patient ID: Russell Spencer, male    DOB: 1966/02/18, 55 y.o.   MRN: [id number]  Chief Complaint  Patient presents with   Skin Problem    HPI Patient is in today for that he had a mole on his right upper thigh that peeled off a couple of months ago he said it just seemed to be irritated and raised and itchy so he started putting Neosporin on it he says he probably used Neosporin for about 4 to 5 weeks.  He says he finally quit using it maybe a week or 2 ago and it actually looks like it is finally starting to flatten and dry up a little bit.  But he wanted to come in and make sure that it looked okay.  Past Medical History:  Diagnosis Date   A-fib (HCC) 12/28/2017   Anxiety    Paroxysmal SVT (supraventricular tachycardia) (HCC) 02/04/2010   Qualifier: Diagnosis of  By: Linford Arnold MD, Santina Evans      No past surgical history on file.  Family History  Problem Relation Age of Onset   Diabetes Mother    Hypertension Mother    Hyperlipidemia Mother    Kidney failure Mother        Dialysis    Social History   Socioeconomic History   Marital status: Married    Spouse name: Not on file   Number of children: 3   Years of education: Not on file   Highest education level: Not on file  Occupational History   Occupation: Geographical information systems officer     Comment: [employer]   Tobacco Use   Smoking status: Never   Smokeless tobacco: Never  Substance and Sexual Activity   Alcohol use: No   Drug use: No   Sexual activity: Yes    Partners: Female  Other Topics Concern   Not on file  Social History Narrative   Some exercise, 5 x a week.     Social Determinants of Health   Financial Resource Strain: Not on file  Food Insecurity: Not on file  Transportation Needs: Not on file  Physical Activity: Not on file  Stress: Not on file  Social Connections: Not on file  Intimate Partner Violence: Not on file    Outpatient Medications Prior to Visit  Medication Sig  Dispense Refill   ALPRAZolam (XANAX) 0.25 MG tablet Take 1 tablet (0.25 mg total) by mouth daily as needed. 10 tablet 0   atorvastatin (LIPITOR) 20 MG tablet TAKE 1 TABLET (20 MG TOTAL) BY MOUTH AT BEDTIME. TAKE 1 TABLET AT BEDTIME 90 tablet 3   clobetasol (OLUX) 0.05 % topical foam Apply topically 2 (two) times daily as needed. 50 g 5   clobetasol cream (TEMOVATE) 0.05 % Apply 1 application topically 2 (two) times daily. 30 g 1   lisinopril (ZESTRIL) 10 MG tablet Take 1 tablet (10 mg total) by mouth daily. 90 tablet 1   metoprolol succinate (TOPROL-XL) 50 MG 24 hr tablet Take 1 tablet (50 mg total) by mouth daily. 90 tablet 1   No facility-administered medications prior to visit.    Allergies  Allergen Reactions   Penicillin G Other (See Comments)    .   Penicillins     REACTION: rash    Review of Systems     Objective:    Physical Exam Vitals reviewed.  Constitutional:      Appearance: He is well-developed.  HENT:  Head: Normocephalic and atraumatic.  Eyes:     Conjunctiva/sclera: Conjunctivae normal.  Cardiovascular:     Rate and Rhythm: Normal rate.  Pulmonary:     Effort: Pulmonary effort is normal.  Skin:    General: Skin is dry.     Coloration: Skin is not pale.     Comments: Right upper anterior thigh there is a hyperpigmented area but almost looks like an ecchymosis.  And along the upper rim is a little bit of dry scaling crusting slightly pink skin.  Neurological:     Mental Status: He is alert and oriented to person, place, and time.  Psychiatric:        Behavior: Behavior normal.    BP 131/84   Pulse 76   Ht 5\' 10"  (1.778 m)   Wt 164 lb (74.4 kg)   SpO2 100%   BMI 23.53 kg/m  Wt Readings from Last 3 Encounters:  08/26/21 164 lb (74.4 kg)  06/03/21 160 lb (72.6 kg)  12/09/20 155 lb (70.3 kg)    There are no preventive care reminders to display for this patient.   There are no preventive care reminders to display for this patient.   Lab  Results  Component Value Date   TSH 1.876 04/30/2014   Lab Results  Component Value Date   WBC 3.0 (L) 12/09/2020   HGB 15.1 12/09/2020   HCT 44.9 12/09/2020   MCV 92.8 12/09/2020   PLT 266 12/09/2020   Lab Results  Component Value Date   NA 138 12/09/2020   K 5.3 12/09/2020   CO2 33 (H) 12/09/2020   GLUCOSE 100 (H) 12/09/2020   BUN 15 12/09/2020   CREATININE 1.04 12/09/2020   BILITOT 1.0 12/09/2020   ALKPHOS 75 09/19/2016   AST 21 12/09/2020   ALT 31 12/09/2020   PROT 7.3 12/09/2020   ALBUMIN 5.1 09/19/2016   CALCIUM 10.5 (H) 12/09/2020   Lab Results  Component Value Date   CHOL 180 12/09/2020   Lab Results  Component Value Date   HDL 50 12/09/2020   Lab Results  Component Value Date   LDLCALC 114 (H) 12/09/2020   Lab Results  Component Value Date   TRIG 70 12/09/2020   Lab Results  Component Value Date   CHOLHDL 3.6 12/09/2020   Lab Results  Component Value Date   HGBA1C 4.9 04/13/2018       Assessment & Plan:   Problem List Items Addressed This Visit   None Visit Diagnoses     Skin lesion    -  Primary      Skin lesion  -recommend discontinuing all triple antibiotics I must wonder if he was starting to get a contact dermatitis and is actually counseled better since he quit using it a couple of weeks ago.  I am getting give him a mild steroid cream just to use for 1 to 2 weeks to see if it continues to improve the healing process.  And if it does not resolve over the next month then please let me know could consider biopsy and/or cryotherapy.   Meds ordered this encounter  Medications   triamcinolone cream (KENALOG) 0.1 %    Sig: Apply 1 application topically daily as needed.    Dispense:  30 g    Refill:  0     04/15/2018, MD

## 2021-09-05 ENCOUNTER — Other Ambulatory Visit: Payer: Self-pay | Admitting: Family Medicine

## 2021-11-04 ENCOUNTER — Telehealth (INDEPENDENT_AMBULATORY_CARE_PROVIDER_SITE_OTHER): Payer: BC Managed Care – PPO | Admitting: Family Medicine

## 2021-11-04 ENCOUNTER — Other Ambulatory Visit: Payer: Self-pay

## 2021-11-04 ENCOUNTER — Encounter: Payer: Self-pay | Admitting: Family Medicine

## 2021-11-04 DIAGNOSIS — U071 COVID-19: Secondary | ICD-10-CM | POA: Diagnosis not present

## 2021-11-04 NOTE — Progress Notes (Signed)
Agree with documentation as above.   Xai Frerking, MD  

## 2021-11-04 NOTE — Progress Notes (Signed)
Pt reports that he will need a letter and be retested to [facility].   He stated that he did test positive at home 5 days ago. He would like to return to work on Monday since he has been out most of the week.   Pt reports that he hasn't had any sxs.  Pt came by and did a Covid swab and was given a note to return to work.

## 2021-11-05 LAB — NOVEL CORONAVIRUS, NAA: SARS-CoV-2, NAA: DETECTED — AB

## 2021-11-05 LAB — SARS-COV-2, NAA 2 DAY TAT

## 2021-11-05 NOTE — Progress Notes (Signed)
Russell Spencer, you are still testing positive.  Per the CDC guidelines as long as you quarantine for 5 days and then mask for an additional 5 you can return back to work after the initial 5 days as long as you are masking.  If you are in a situation where you are unable to mask then you do need to do the full 10-day quarantine.  At the end of the 10 days you can return to work whether your Pap test is positive or not again according to the guidelines.  But if they are requiring a negative test we are more than happy to swab you again on Monday.

## 2021-12-07 ENCOUNTER — Ambulatory Visit: Payer: BC Managed Care – PPO | Admitting: Family Medicine

## 2021-12-07 ENCOUNTER — Other Ambulatory Visit: Payer: Self-pay

## 2021-12-07 ENCOUNTER — Encounter: Payer: Self-pay | Admitting: Family Medicine

## 2021-12-07 VITALS — BP 125/85 | HR 64 | Resp 16 | Ht 70.0 in | Wt 163.0 lb

## 2021-12-07 DIAGNOSIS — I10 Essential (primary) hypertension: Secondary | ICD-10-CM

## 2021-12-07 DIAGNOSIS — L301 Dyshidrosis [pompholyx]: Secondary | ICD-10-CM | POA: Diagnosis not present

## 2021-12-07 DIAGNOSIS — E7800 Pure hypercholesterolemia, unspecified: Secondary | ICD-10-CM

## 2021-12-07 NOTE — Assessment & Plan Note (Signed)
He says he really has not had a lot of flares recently on his palms its actually been under pretty good control.

## 2021-12-07 NOTE — Progress Notes (Signed)
Established Patient Office Visit  Subjective:  Patient ID: Russell Spencer, male    DOB: Sep 05, 1966  Age: 56 y.o. MRN: [id number]  CC:  Chief Complaint  Patient presents with   Hypertension    Follow up     HPI [name] presents for   Hypertension- Pt denies chest pain, SOB, dizziness, or heart palpitations.  Taking meds as directed w/o problems.  Denies medication side effects.  He says he normally takes his medications in the evening.  No recent side effects or concerns.  Not actively exercising.  His daughter is looking at colleges for the fall.  Hyperlipidemia - tolerating stating well with no myalgias or significant side effects.  Lab Results  Component Value Date   CHOL 180 12/09/2020   HDL 50 12/09/2020   LDLCALC 114 (H) 12/09/2020   TRIG 70 12/09/2020   CHOLHDL 3.6 12/09/2020    He also reports that his mood is in a good place.  No concerns.  He is not currently on medication.  Past Medical History:  Diagnosis Date   A-fib (HCC) 12/28/2017   Anxiety    Paroxysmal SVT (supraventricular tachycardia) (HCC) 02/04/2010   Qualifier: Diagnosis of  By: Linford Arnold MD, Santina Evans      History reviewed. No pertinent surgical history.  Family History  Problem Relation Age of Onset   Diabetes Mother    Hypertension Mother    Hyperlipidemia Mother    Kidney failure Mother        Dialysis    Social History   Socioeconomic History   Marital status: Married    Spouse name: Not on file   Number of children: 3   Years of education: Not on file   Highest education level: Not on file  Occupational History   Occupation: Geographical information systems officer     Comment: [employer]   Tobacco Use   Smoking status: Never   Smokeless tobacco: Never  Substance and Sexual Activity   Alcohol use: No   Drug use: No   Sexual activity: Yes    Partners: Female  Other Topics Concern   Not on file  Social History Narrative   Some exercise, 5 x a week.     Social Determinants of Health   Financial  Resource Strain: Not on file  Food Insecurity: Not on file  Transportation Needs: Not on file  Physical Activity: Not on file  Stress: Not on file  Social Connections: Not on file  Intimate Partner Violence: Not on file    Outpatient Medications Prior to Visit  Medication Sig Dispense Refill   atorvastatin (LIPITOR) 20 MG tablet TAKE 1 TABLET (20 MG TOTAL) BY MOUTH AT BEDTIME. TAKE 1 TABLET AT BEDTIME 90 tablet 0   clobetasol (OLUX) 0.05 % topical foam Apply topically 2 (two) times daily as needed. 50 g 5   clobetasol cream (TEMOVATE) 0.05 % Apply 1 application topically 2 (two) times daily. 30 g 1   lisinopril (ZESTRIL) 10 MG tablet Take 1 tablet (10 mg total) by mouth daily. 90 tablet 1   metoprolol succinate (TOPROL-XL) 50 MG 24 hr tablet Take 1 tablet (50 mg total) by mouth daily. 90 tablet 1   triamcinolone cream (KENALOG) 0.1 % Apply 1 application topically daily as needed. 30 g 0   No facility-administered medications prior to visit.    Allergies  Allergen Reactions   Penicillin G Other (See Comments)    .   Penicillins     REACTION: rash  ROS Review of Systems    Objective:    Physical Exam Constitutional:      Appearance: Normal appearance. He is well-developed.  HENT:     Head: Normocephalic and atraumatic.  Cardiovascular:     Rate and Rhythm: Normal rate and regular rhythm.     Heart sounds: Normal heart sounds.  Pulmonary:     Effort: Pulmonary effort is normal.     Breath sounds: Normal breath sounds.  Skin:    General: Skin is warm and dry.  Neurological:     Mental Status: He is alert and oriented to person, place, and time. Mental status is at baseline.  Psychiatric:        Behavior: Behavior normal.    BP 125/85    Pulse 64    Resp 16    Ht 5\' 10"  (1.778 m)    Wt 163 lb (73.9 kg)    BMI 23.39 kg/m  Wt Readings from Last 3 Encounters:  12/07/21 163 lb (73.9 kg)  08/26/21 164 lb (74.4 kg)  06/03/21 160 lb (72.6 kg)     There are no  preventive care reminders to display for this patient.   There are no preventive care reminders to display for this patient.  Lab Results  Component Value Date   TSH 1.876 04/30/2014   Lab Results  Component Value Date   WBC 3.0 (L) 12/09/2020   HGB 15.1 12/09/2020   HCT 44.9 12/09/2020   MCV 92.8 12/09/2020   PLT 266 12/09/2020   Lab Results  Component Value Date   NA 138 12/09/2020   K 5.3 12/09/2020   CO2 33 (H) 12/09/2020   GLUCOSE 100 (H) 12/09/2020   BUN 15 12/09/2020   CREATININE 1.04 12/09/2020   BILITOT 1.0 12/09/2020   ALKPHOS 75 09/19/2016   AST 21 12/09/2020   ALT 31 12/09/2020   PROT 7.3 12/09/2020   ALBUMIN 5.1 09/19/2016   CALCIUM 10.5 (H) 12/09/2020   Lab Results  Component Value Date   CHOL 180 12/09/2020   Lab Results  Component Value Date   HDL 50 12/09/2020   Lab Results  Component Value Date   LDLCALC 114 (H) 12/09/2020   Lab Results  Component Value Date   TRIG 70 12/09/2020   Lab Results  Component Value Date   CHOLHDL 3.6 12/09/2020   Lab Results  Component Value Date   HGBA1C 4.9 04/13/2018      Assessment & Plan:   Problem List Items Addressed This Visit       Cardiovascular and Mediastinum   Essential hypertension - Primary    Well controlled. Continue current regimen. Follow up in  6 mo         Musculoskeletal and Integument   DYSHIDROTIC ECZEMA    He says he really has not had a lot of flares recently on his palms its actually been under pretty good control.        Other   Hyperlipidemia    Continue daily statin.  Plan to recheck lipids and liver enzymes next month when he comes in for his physical.       He is due for some labs.  Last checked in February of last year.  He wants to schedule his physical next month and get it done the same day.  No orders of the defined types were placed in this encounter.   Follow-up: Return in about 6 weeks (around 01/17/2022) for Physical and labs  .  Nani Gasser, MD

## 2021-12-07 NOTE — Assessment & Plan Note (Signed)
Continue daily statin.  Plan to recheck lipids and liver enzymes next month when he comes in for his physical.

## 2021-12-07 NOTE — Assessment & Plan Note (Signed)
Well controlled. Continue current regimen. Follow up in  6 mo  

## 2021-12-08 ENCOUNTER — Other Ambulatory Visit: Payer: Self-pay | Admitting: Family Medicine

## 2021-12-08 DIAGNOSIS — I10 Essential (primary) hypertension: Secondary | ICD-10-CM

## 2022-01-01 ENCOUNTER — Other Ambulatory Visit: Payer: Self-pay | Admitting: Family Medicine

## 2022-01-01 DIAGNOSIS — I471 Supraventricular tachycardia: Secondary | ICD-10-CM

## 2022-01-01 DIAGNOSIS — I10 Essential (primary) hypertension: Secondary | ICD-10-CM

## 2022-01-26 ENCOUNTER — Encounter: Payer: BC Managed Care – PPO | Admitting: Family Medicine

## 2022-03-03 ENCOUNTER — Encounter: Payer: Self-pay | Admitting: Family Medicine

## 2022-03-03 ENCOUNTER — Ambulatory Visit (INDEPENDENT_AMBULATORY_CARE_PROVIDER_SITE_OTHER): Payer: BC Managed Care – PPO | Admitting: Family Medicine

## 2022-03-03 VITALS — BP 126/79 | HR 60 | Resp 16 | Ht 70.0 in | Wt 162.0 lb

## 2022-03-03 DIAGNOSIS — Z0000 Encounter for general adult medical examination without abnormal findings: Secondary | ICD-10-CM | POA: Diagnosis not present

## 2022-03-03 NOTE — Patient Instructions (Signed)
Recommend a trial of Zyrtec at bedtime to see if helps with the welts.   ?

## 2022-03-03 NOTE — Progress Notes (Signed)
? ?Complete physical exam ? ?Patient: Russell Spencer   DOB: [birth year]   56 y.o. Male  MRN: [id number] ? ?Subjective:  ?  ?Chief Complaint  ?Patient presents with  ? Annual Exam  ?  Fasting   ? Skin Concern  ?  Patient stated he itches all over and when he scratches, he develops hives. Patient stated the hives has been off and on for 1 month.    ? ? ?Russell Spencer is a 56 y.o. male who presents today for a complete physical exam. He reports consuming a general diet. Home exercise routine includes walking. He generally feels well. He reports sleeping well. He does have additional problems to discuss today.  ? ? ?Patient stated he itches all over and when he scratches, he develops hives. Patient stated the hives has been off and on for 1 month.  ? ? ? ?Most recent fall risk assessment: ? ?  03/03/2022  ?  8:36 AM  ?Fall Risk   ?Falls in the past year? 0  ?Number falls in past yr: 0  ?Injury with Fall? 0  ?Risk for fall due to : No Fall Risks  ?Follow up Falls prevention discussed;Falls evaluation completed  ? ?  ?Most recent depression screenings: ? ?  03/03/2022  ?  8:36 AM 12/07/2021  ?  4:13 PM  ?PHQ 2/9 Scores  ?PHQ - 2 Score 0 0  ? ? ? ? ?Past Medical History:  ?Diagnosis Date  ? A-fib ([place]) 12/28/2017  ? Anxiety   ? Paroxysmal SVT (supraventricular tachycardia) ([place]) 02/04/2010  ? Qualifier: Diagnosis of  By: Madilyn Fireman MD, Barnetta Chapel    ? ?Social History  ? ?Tobacco Use  ? Smoking status: Never  ? Smokeless tobacco: Never  ?Substance Use Topics  ? Alcohol use: Yes  ?  Comment: wine occ  ? Drug use: No  ? ?Allergies  ?Allergen Reactions  ? Penicillin G Other (See Comments)  ?  .  ? Penicillins   ?  REACTION: rash  ? ?  ? ?Patient Care Team: ?Hali Marry, MD as PCP - General  ? ?Outpatient Medications Prior to Visit  ?Medication Sig  ? atorvastatin (LIPITOR) 20 MG tablet TAKE 1 TABLET (20 MG TOTAL) BY MOUTH AT BEDTIME. TAKE 1 TABLET AT BEDTIME  ? clobetasol (OLUX) 0.05 % topical foam Apply topically 2 (two) times daily  as needed.  ? clobetasol cream (TEMOVATE) [id number] % Apply 1 application topically 2 (two) times daily.  ? lisinopril (ZESTRIL) 10 MG tablet TAKE 1 TABLET BY MOUTH EVERY DAY  ? metoprolol succinate (TOPROL-XL) 50 MG 24 hr tablet TAKE 1 TABLET BY MOUTH EVERY DAY  ? triamcinolone cream (KENALOG) 0.1 % Apply 1 application topically daily as needed.  ? ?No facility-administered medications prior to visit.  ? ? ?ROS ? ? ? ? ?   ?Objective:  ? ?  ?BP 126/79   Pulse 60   Resp 16   Ht 5\' 10"  (1.778 m)   Wt 162 lb (73.5 kg)   SpO2 98%   BMI 23.24 kg/m?  ?  ? ?Physical Exam ?Constitutional:   ?   Appearance: He is well-developed.  ?HENT:  ?   Head: Normocephalic and atraumatic.  ?   Right Ear: External ear normal.  ?   Left Ear: External ear normal.  ?   Nose: Nose normal.  ?Eyes:  ?   Conjunctiva/sclera: Conjunctivae normal.  ?   Pupils: Pupils are equal, round, and reactive to light.  ?  Neck:  ?   Thyroid: No thyromegaly.  ?Cardiovascular:  ?   Rate and Rhythm: Normal rate and regular rhythm.  ?   Heart sounds: Normal heart sounds.  ?Pulmonary:  ?   Effort: Pulmonary effort is normal.  ?   Breath sounds: Normal breath sounds.  ?Abdominal:  ?   General: Bowel sounds are normal. There is no distension.  ?   Palpations: Abdomen is soft. There is no mass.  ?   Tenderness: There is no abdominal tenderness. There is no guarding or rebound.  ?Musculoskeletal:     ?   General: Normal range of motion.  ?   Cervical back: Normal range of motion and neck supple.  ?Lymphadenopathy:  ?   Cervical: No cervical adenopathy.  ?Skin: ?   General: Skin is warm and dry.  ?Neurological:  ?   Mental Status: He is alert and oriented to person, place, and time.  ?   Deep Tendon Reflexes: Reflexes are normal and symmetric.  ?Psychiatric:     ?   Behavior: Behavior normal.     ?   Thought Content: Thought content normal.     ?   Judgment: Judgment normal.  ?  ? ?No results found for any visits on 03/03/22. ?  ?   ?Assessment & Plan:  ?  ?Routine  Health Maintenance and Physical Exam ? ?Immunization History  ?Administered Date(s) Administered  ? Moderna Sars-Covid-2 Vaccination 12/02/2019, 12/29/2019  ? ? ?Health Maintenance  ?Topic Date Due  ? TETANUS/TDAP  12/07/2022 (Originally 11/08/2021)  ? COVID-19 Vaccine (3 - Booster for Moderna series) 09/12/2023 (Originally 02/23/2020)  ? Zoster Vaccines- Shingrix (1 of 2) 11/27/2023 (Originally 08/12/2016)  ? INFLUENZA VACCINE  05/31/2022  ? Fecal DNA (Cologuard)  06/03/2022  ? Hepatitis C Screening  Completed  ? HIV Screening  Completed  ? Pneumococcal Vaccine 81-67 Years old  Aged Out  ? HPV VACCINES  Aged Out  ? ? ?Discussed health benefits of physical activity, and encouraged him to engage in regular exercise appropriate for his age and condition. ? ?Problem List Items Addressed This Visit   ?None ?Visit Diagnoses   ? ? Wellness examination    -  Primary  ? Relevant Orders  ? PSA  ? Lipid panel  ? COMPLETE METABOLIC PANEL WITH GFR  ? CBC  ? ?  ? ?Return in about 1 year (around 03/04/2023) for Wellness Exam. ? ? Keep up a regular exercise program and make sure you are eating a healthy diet ?Declines Tdap and Shingrix today.  ? ?Beatrice Lecher, MD ? ? ?

## 2022-03-04 ENCOUNTER — Other Ambulatory Visit: Payer: Self-pay | Admitting: *Deleted

## 2022-03-04 DIAGNOSIS — E875 Hyperkalemia: Secondary | ICD-10-CM

## 2022-03-04 LAB — COMPLETE METABOLIC PANEL WITH GFR
AG Ratio: 1.9 (calc) (ref 1.0–2.5)
ALT: 28 U/L (ref 9–46)
AST: 18 U/L (ref 10–35)
Albumin: 4.8 g/dL (ref 3.6–5.1)
Alkaline phosphatase (APISO): 65 U/L (ref 35–144)
BUN: 13 mg/dL (ref 7–25)
CO2: 31 mmol/L (ref 20–32)
Calcium: 10.5 mg/dL — ABNORMAL HIGH (ref 8.6–10.3)
Chloride: 101 mmol/L (ref 98–110)
Creat: 1.16 mg/dL (ref 0.70–1.30)
Globulin: 2.5 g/dL (calc) (ref 1.9–3.7)
Glucose, Bld: 97 mg/dL (ref 65–99)
Potassium: 5.6 mmol/L — ABNORMAL HIGH (ref 3.5–5.3)
Sodium: 138 mmol/L (ref 135–146)
Total Bilirubin: 1.3 mg/dL — ABNORMAL HIGH (ref 0.2–1.2)
Total Protein: 7.3 g/dL (ref 6.1–8.1)
eGFR: 74 mL/min/{1.73_m2} (ref >=60)

## 2022-03-04 LAB — CBC
HCT: 46.6 % (ref 38.5–50.0)
Hemoglobin: 15.2 g/dL (ref 13.2–17.1)
MCH: 30.3 pg (ref 27.0–33.0)
MCHC: 32.6 g/dL (ref 32.0–36.0)
MCV: 92.8 fL (ref 80.0–100.0)
MPV: 10.6 fL (ref 7.5–12.5)
Platelets: 256 10*3/uL (ref 140–400)
RBC: 5.02 10*6/uL (ref 4.20–5.80)
RDW: 11.7 % (ref 11.0–15.0)
WBC: 3.5 10*3/uL — ABNORMAL LOW (ref 3.8–10.8)

## 2022-03-04 LAB — LIPID PANEL
Cholesterol: 178 mg/dL (ref <200)
HDL: 49 mg/dL (ref >=40)
LDL Cholesterol (Calc): 110 mg/dL (calc) — ABNORMAL HIGH
Non-HDL Cholesterol (Calc): 129 mg/dL (calc) (ref <130)
Total CHOL/HDL Ratio: 3.6 (calc) (ref <5.0)
Triglycerides: 92 mg/dL (ref <150)

## 2022-03-04 LAB — PSA: PSA: 1.12 ng/mL (ref <=4.00)

## 2022-03-04 NOTE — Progress Notes (Signed)
Hi Russell Spencer, LDL at 110 just slightly above normal but otherwise looking good.  Your potassium level was a little elevated.  Normally it looks pretty good.  This can sometimes come from hemolysis of the blood work but we need to need to recheck it and make sure that it is okay so lets plan to recheck your potassium in 1 to 2 weeks.  Bilirubin is slightly elevated but stable.  Blood count is stable.  Prostate test is normal.

## 2022-03-10 ENCOUNTER — Other Ambulatory Visit: Payer: Self-pay | Admitting: Family Medicine

## 2022-04-09 LAB — POTASSIUM: Potassium: 4.6 mmol/L (ref 3.5–5.3)

## 2022-04-11 NOTE — Progress Notes (Signed)
Hi Jamarr, repeat potassium looks good.

## 2022-06-15 ENCOUNTER — Other Ambulatory Visit: Payer: Self-pay | Admitting: Family Medicine

## 2022-06-15 DIAGNOSIS — I10 Essential (primary) hypertension: Secondary | ICD-10-CM

## 2022-06-21 ENCOUNTER — Other Ambulatory Visit: Payer: Self-pay

## 2022-06-21 DIAGNOSIS — Z1211 Encounter for screening for malignant neoplasm of colon: Secondary | ICD-10-CM

## 2022-06-21 NOTE — Progress Notes (Signed)
-   Ordered Cologuard.

## 2022-07-09 ENCOUNTER — Other Ambulatory Visit: Payer: Self-pay | Admitting: Family Medicine

## 2022-07-09 DIAGNOSIS — I10 Essential (primary) hypertension: Secondary | ICD-10-CM

## 2022-07-09 DIAGNOSIS — I471 Supraventricular tachycardia: Secondary | ICD-10-CM

## 2022-07-25 LAB — COLOGUARD: COLOGUARD: NEGATIVE

## 2022-07-26 NOTE — Progress Notes (Signed)
Great news! Your Cologuard test is negative.  Recommend repeat colon cancer screening in 3 years.

## 2022-12-12 ENCOUNTER — Other Ambulatory Visit: Payer: Self-pay | Admitting: Family Medicine

## 2022-12-12 DIAGNOSIS — I471 Supraventricular tachycardia, unspecified: Secondary | ICD-10-CM

## 2022-12-12 DIAGNOSIS — I10 Essential (primary) hypertension: Secondary | ICD-10-CM

## 2022-12-28 ENCOUNTER — Other Ambulatory Visit: Payer: Self-pay | Admitting: Family Medicine

## 2022-12-28 DIAGNOSIS — I10 Essential (primary) hypertension: Secondary | ICD-10-CM

## 2023-01-13 ENCOUNTER — Other Ambulatory Visit: Payer: Self-pay | Admitting: Family Medicine

## 2023-01-13 DIAGNOSIS — I10 Essential (primary) hypertension: Secondary | ICD-10-CM

## 2023-01-13 DIAGNOSIS — I471 Supraventricular tachycardia, unspecified: Secondary | ICD-10-CM

## 2023-03-06 ENCOUNTER — Encounter: Payer: BC Managed Care – PPO | Admitting: Family Medicine

## 2023-03-21 ENCOUNTER — Encounter: Payer: BC Managed Care – PPO | Admitting: Family Medicine

## 2023-03-27 ENCOUNTER — Other Ambulatory Visit: Payer: Self-pay | Admitting: Family Medicine

## 2023-03-28 ENCOUNTER — Other Ambulatory Visit: Payer: Self-pay | Admitting: Family Medicine

## 2023-03-28 DIAGNOSIS — I10 Essential (primary) hypertension: Secondary | ICD-10-CM

## 2023-05-10 ENCOUNTER — Encounter: Payer: Self-pay | Admitting: Family Medicine

## 2023-05-10 ENCOUNTER — Ambulatory Visit (INDEPENDENT_AMBULATORY_CARE_PROVIDER_SITE_OTHER): Payer: BC Managed Care – PPO | Admitting: Family Medicine

## 2023-05-10 VITALS — BP 124/82 | HR 62 | Ht 70.0 in | Wt 161.0 lb

## 2023-05-10 DIAGNOSIS — L821 Other seborrheic keratosis: Secondary | ICD-10-CM

## 2023-05-10 DIAGNOSIS — Z0000 Encounter for general adult medical examination without abnormal findings: Secondary | ICD-10-CM | POA: Diagnosis not present

## 2023-05-10 NOTE — Progress Notes (Signed)
Complete physical exam  Patient: Russell Spencer   DOB: 1966-10-08   57 y.o. Male  MRN: [id number]  Subjective:    Chief Complaint  Patient presents with   Annual Exam    Russell Spencer is a 57 y.o. male who presents today for a complete physical exam. He reports consuming a general diet. Gym/ health club routine includes cardio and mod to heavy weightlifting. He generally feels well. He reports sleeping well. He does not have additional problems to discuss today.    Most recent fall risk assessment:    05/10/2023    8:45 AM  Fall Risk   Falls in the past year? 0  Number falls in past yr: 0  Injury with Fall? 0  Risk for fall due to : No Fall Risks  Follow up Falls evaluation completed     Most recent depression screenings:    05/10/2023    8:45 AM 03/03/2022    8:36 AM  PHQ 2/9 Scores  PHQ - 2 Score 0 0      Social History   Tobacco Use   Smoking status: Never   Smokeless tobacco: Never  Substance Use Topics   Alcohol use: Yes    Comment: wine occ   Drug use: No   Family History  Problem Relation Age of Onset   Diabetes Mother    Hypertension Mother    Hyperlipidemia Mother    Kidney failure Mother        Dialysis      Patient Care Team: Agapito Games, MD as PCP - General   Outpatient Medications Prior to Visit  Medication Sig   atorvastatin (LIPITOR) 20 MG tablet TAKE 1 TABLET (20 MG TOTAL) BY MOUTH AT BEDTIME. TAKE 1 TABLET AT BEDTIME   clobetasol (OLUX) 0.05 % topical foam Apply topically 2 (two) times daily as needed.   clobetasol cream (TEMOVATE) 0.05 % Apply 1 application topically 2 (two) times daily.   lisinopril (ZESTRIL) 10 MG tablet TAKE 1 TABLET BY MOUTH EVERY DAY   metoprolol succinate (TOPROL-XL) 50 MG 24 hr tablet TAKE 1 TABLET BY MOUTH EVERY DAY   triamcinolone cream (KENALOG) 0.1 % Apply 1 application topically daily as needed.   No facility-administered medications prior to visit.    ROS        Objective:     BP  124/82   Pulse 62   Ht 5\' 10"  (1.778 m)   Wt 161 lb (73 kg)   SpO2 100%   BMI 23.10 kg/m     Physical Exam Constitutional:      Appearance: Normal appearance. He is well-developed.  HENT:     Head: Normocephalic and atraumatic.     Right Ear: Tympanic membrane, ear canal and external ear normal.     Left Ear: Tympanic membrane, ear canal and external ear normal.     Nose: Nose normal.     Mouth/Throat:     Pharynx: Oropharynx is clear.  Eyes:     Conjunctiva/sclera: Conjunctivae normal.     Pupils: Pupils are equal, round, and reactive to light.  Neck:     Thyroid: No thyromegaly.  Cardiovascular:     Rate and Rhythm: Normal rate and regular rhythm.     Heart sounds: Normal heart sounds.  Pulmonary:     Effort: Pulmonary effort is normal.     Breath sounds: Normal breath sounds.  Abdominal:     General: Bowel sounds are normal. There is no distension.  Palpations: Abdomen is soft. There is no mass.     Tenderness: There is no abdominal tenderness. There is no guarding or rebound.  Musculoskeletal:        General: Normal range of motion.     Cervical back: Normal range of motion and neck supple. No tenderness.  Lymphadenopathy:     Cervical: No cervical adenopathy.  Skin:    General: Skin is warm and dry.  Neurological:     Mental Status: He is alert and oriented to person, place, and time.     Deep Tendon Reflexes: Reflexes are normal and symmetric.  Psychiatric:        Behavior: Behavior normal.        Thought Content: Thought content normal.        Judgment: Judgment normal.      No results found for any visits on 05/10/23. Last metabolic panel Lab Results  Component Value Date   GLUCOSE 97 03/03/2022   NA 138 03/03/2022   K 4.6 04/08/2022   CL 101 03/03/2022   CO2 31 03/03/2022   BUN 13 03/03/2022   CREATININE 1.16 03/03/2022   EGFR 74 03/03/2022   CALCIUM 10.5 (H) 03/03/2022   PROT 7.3 03/03/2022   ALBUMIN 5.1 09/19/2016   BILITOT 1.3 (H)  03/03/2022   ALKPHOS 75 09/19/2016   AST 18 03/03/2022   ALT 28 03/03/2022        Assessment & Plan:    Routine Health Maintenance and Physical Exam  Immunization History  Administered Date(s) Administered   Moderna Sars-Covid-2 Vaccination 12/02/2019, 12/29/2019    Health Maintenance  Topic Date Due   DTaP/Tdap/Td (1 - Tdap) Never done   Zoster Vaccines- Shingrix (1 of 2) 11/27/2023 (Originally 08/12/2016)   COVID-19 Vaccine (3 - 2023-24 season) 10/30/2024 (Originally 07/01/2022)   INFLUENZA VACCINE  06/01/2023   Fecal DNA (Cologuard)  07/20/2025   Hepatitis C Screening  Completed   HIV Screening  Completed   Pneumococcal Vaccine 74-37 Years old  Aged Out   HPV VACCINES  Aged Out    Discussed health benefits of physical activity, and encouraged him to engage in regular exercise appropriate for his age and condition.  Problem List Items Addressed This Visit       Musculoskeletal and Integument   Dermatosis papulosa nigra   Other Visit Diagnoses     Wellness examination    -  Primary   Relevant Orders   COMPLETE METABOLIC PANEL WITH GFR   Lipid Panel w/reflex Direct LDL   PSA       Keep up a regular exercise program and make sure you are eating a healthy diet Try to eat 4 servings of dairy a day, or if you are lactose intolerant take a calcium with vitamin D daily.  Due for Tdap and Shingles.  He declines today but says he will schedule another future appointment for that.  He also has a few skin lesions around his neck some are more raised and papular similar to a skin tag.  He would like to reschedule at some point to have those removed he has a few that are little bit more flat that we can probably do some cryotherapy on it as well.  He can schedule at his convenience.  Return in about 1 year (around 05/09/2024) for Wellness Exam.     Nani Gasser, MD

## 2023-05-18 LAB — COMPLETE METABOLIC PANEL WITH GFR
AG Ratio: 1.7 (calc) (ref 1.0–2.5)
ALT: 28 U/L (ref 9–46)
AST: 20 U/L (ref 10–35)
Albumin: 4.6 g/dL (ref 3.6–5.1)
Alkaline phosphatase (APISO): 62 U/L (ref 35–144)
BUN: 11 mg/dL (ref 7–25)
CO2: 29 mmol/L (ref 20–32)
Calcium: 10.5 mg/dL — ABNORMAL HIGH (ref 8.6–10.3)
Chloride: 101 mmol/L (ref 98–110)
Creat: 1.07 mg/dL (ref 0.70–1.30)
Globulin: 2.7 g/dL (calc) (ref 1.9–3.7)
Glucose, Bld: 98 mg/dL (ref 65–99)
Potassium: 5 mmol/L (ref 3.5–5.3)
Sodium: 139 mmol/L (ref 135–146)
Total Bilirubin: 1.2 mg/dL (ref 0.2–1.2)
Total Protein: 7.3 g/dL (ref 6.1–8.1)
eGFR: 81 mL/min/{1.73_m2} (ref >=60)

## 2023-05-18 LAB — LIPID PANEL W/REFLEX DIRECT LDL
Cholesterol: 174 mg/dL (ref <200)
HDL: 54 mg/dL (ref >=40)
LDL Cholesterol (Calc): 102 mg/dL (calc) — ABNORMAL HIGH
Non-HDL Cholesterol (Calc): 120 mg/dL (calc) (ref <130)
Total CHOL/HDL Ratio: 3.2 (calc) (ref <5.0)
Triglycerides: 87 mg/dL (ref <150)

## 2023-05-18 LAB — PSA: PSA: 1.3 ng/mL (ref <=4.00)

## 2023-05-18 NOTE — Progress Notes (Signed)
Hi Farrel, calcium levels just slightly elevated but stable not in a worrisome range.  Potassium looks good this time.  Kidney liver function are good.  LDL cholesterol is just borderline but it actually does look better this year so good work in bringing that down.  Prostate test is normal.

## 2023-07-05 ENCOUNTER — Ambulatory Visit: Payer: BC Managed Care – PPO | Admitting: Family Medicine

## 2023-07-09 ENCOUNTER — Other Ambulatory Visit: Payer: Self-pay | Admitting: Family Medicine

## 2023-07-09 DIAGNOSIS — I10 Essential (primary) hypertension: Secondary | ICD-10-CM

## 2023-07-09 DIAGNOSIS — I471 Supraventricular tachycardia, unspecified: Secondary | ICD-10-CM

## 2023-07-25 ENCOUNTER — Ambulatory Visit: Payer: BC Managed Care – PPO | Admitting: Family Medicine

## 2023-07-25 ENCOUNTER — Encounter: Payer: Self-pay | Admitting: Family Medicine

## 2023-07-25 VITALS — BP 128/72 | HR 63 | Ht 70.0 in | Wt 164.0 lb

## 2023-07-25 DIAGNOSIS — L918 Other hypertrophic disorders of the skin: Secondary | ICD-10-CM | POA: Diagnosis not present

## 2023-07-25 DIAGNOSIS — L821 Other seborrheic keratosis: Secondary | ICD-10-CM

## 2023-07-25 NOTE — Progress Notes (Signed)
Acute Office Visit  Subjective:     Patient ID: Russell Spencer, male    DOB: Jul 04, 1966, 57 y.o.   MRN: [id number]  Chief Complaint  Patient presents with   skin tag removal    HPI Patient is in today for skin tag removal around neck and some seb keratoses on chest.     ROS      Objective:    BP 128/72   Pulse 63   Ht 5\' 10"  (1.778 m)   Wt 164 lb (74.4 kg)   SpO2 100%   BMI 23.53 kg/m     Physical Exam  No results found for any visits on 07/25/23.      Assessment & Plan:   Problem List Items Addressed This Visit   None Visit Diagnoses     Inflamed skin tag    -  Primary   Seborrheic keratoses          Skin Tag Removal Procedure Note Diagnosis: normal skin tags Location:  neck  Informed Consent: Discussed risks (permanent scarring, infection, pain, bleeding, bruising, redness, and recurrence of the lesion) and benefits of the procedure, as well as the alternatives. He is aware that skin tags are benign lesions, and their removal is often not considered medically necessary. Informed consent was obtained. Preparation: The area was prepared in a standard fashion. Anesthesia: not required Procedure Details: Iris scissors were used to perform sharp removal. Aluminum chloride was applied for hemostasis. Ointment and bandage were applied where needed. The patient tolerated the procedure well. Total number of lesions treated: 4 Plan: The patient was instructed on post-op care. Recommend OTC analgesia as needed for pain.  Cryotherapy Procedure Note  Pre-operative Diagnosis: seb keratoses   Post-operative Diagnosis: same   Locations: Upper ant chest  and neck   Indications: irritation from collar and clothing   Procedure Details  Patient informed of risks (permanent scarring, infection, light or dark discoloration, bleeding, infection, weakness, numbness and recurrence of the lesion) and benefits of the procedure and verbal informed consent obtained.  The  areas are treated with liquid nitrogen therapy, frozen until ice ball extended 1-2 mm beyond lesion, allowed to thaw, and treated again. The patient tolerated procedure well.  The patient was instructed on post-op care, warned that there may be blister formation, redness and pain. Recommend OTC analgesia as needed for pain.  Condition: Stable  Complications: none.  Plan: 1. Instructed to keep the area dry and covered for 24-48h and clean thereafter. 2. Warning signs of infection were reviewed.   3. Recommended that the patient use OTC acetaminophen as needed for pain.  4. Return PRN   No orders of the defined types were placed in this encounter.   Return if symptoms worsen or fail to improve.  Nani Gasser, MD

## 2023-09-23 ENCOUNTER — Other Ambulatory Visit: Payer: Self-pay | Admitting: Family Medicine

## 2023-09-23 DIAGNOSIS — I10 Essential (primary) hypertension: Secondary | ICD-10-CM

## 2024-01-03 ENCOUNTER — Other Ambulatory Visit: Payer: Self-pay | Admitting: Family Medicine

## 2024-01-03 DIAGNOSIS — I471 Supraventricular tachycardia, unspecified: Secondary | ICD-10-CM

## 2024-01-03 DIAGNOSIS — I10 Essential (primary) hypertension: Secondary | ICD-10-CM

## 2024-01-05 ENCOUNTER — Other Ambulatory Visit: Payer: Self-pay | Admitting: Family Medicine

## 2024-01-05 DIAGNOSIS — I10 Essential (primary) hypertension: Secondary | ICD-10-CM

## 2024-04-01 ENCOUNTER — Other Ambulatory Visit: Payer: Self-pay | Admitting: Family Medicine

## 2024-06-30 ENCOUNTER — Other Ambulatory Visit: Payer: Self-pay | Admitting: Family Medicine

## 2024-06-30 DIAGNOSIS — I471 Supraventricular tachycardia, unspecified: Secondary | ICD-10-CM

## 2024-06-30 DIAGNOSIS — I10 Essential (primary) hypertension: Secondary | ICD-10-CM

## 2024-08-01 ENCOUNTER — Other Ambulatory Visit: Payer: Self-pay | Admitting: Family Medicine

## 2024-08-01 DIAGNOSIS — I10 Essential (primary) hypertension: Secondary | ICD-10-CM

## 2024-08-01 DIAGNOSIS — I471 Supraventricular tachycardia, unspecified: Secondary | ICD-10-CM

## 2024-08-01 NOTE — Telephone Encounter (Signed)
 Called patient left vm to call back and scheduled overdue ov and labs

## 2024-08-01 NOTE — Telephone Encounter (Signed)
 Please advised pt that he is OVERDUE for OV and labs. He appointment required for refills. Thanks!

## 2024-09-09 ENCOUNTER — Encounter: Payer: Self-pay | Admitting: Family Medicine

## 2024-09-09 ENCOUNTER — Ambulatory Visit: Admitting: Family Medicine

## 2024-09-09 VITALS — BP 128/76 | HR 76 | Ht 70.0 in | Wt 161.1 lb

## 2024-09-09 DIAGNOSIS — L301 Dyshidrosis [pompholyx]: Secondary | ICD-10-CM

## 2024-09-09 DIAGNOSIS — Z0000 Encounter for general adult medical examination without abnormal findings: Secondary | ICD-10-CM | POA: Diagnosis not present

## 2024-09-09 DIAGNOSIS — F411 Generalized anxiety disorder: Secondary | ICD-10-CM

## 2024-09-09 MED ORDER — CLOBETASOL PROPIONATE 0.05 % EX FOAM: Freq: Two times a day (BID) | CUTANEOUS | 5 refills | Status: AC | PRN

## 2024-09-09 NOTE — Progress Notes (Unsigned)
   Complete physical exam  Patient: Russell Spencer    DOB: 05-17-1966 58 y.o.   MRN: [id number]  Chief Complaint  Patient presents with   Annual Exam    Subjective:    Russell Spencer is a 58 y.o. male who presents today for a complete physical exam. He reports consuming a general diet. Walks and does resistance training.  He generally feels {DESC; WELL/FAIRLY WELL/POORLY:18703}. He reports sleeping {DESC; WELL/FAIRLY WELL/POORLY:18703}. He {does/does not:200015} have additional problems to discuss today.   Discussed the use of AI scribe software for clinical note transcription with the patient, who gave verbal consent to proceed.  History of Present Illness    Most recent fall risk assessment:    09/09/2024    8:05 AM  Fall Risk   Falls in the past year? 0  Number falls in past yr: 0  Injury with Fall? 0  Risk for fall due to : No Fall Risks  Follow up Falls evaluation completed     Most recent depression screenings:    09/09/2024    8:05 AM 05/10/2023    8:45 AM  PHQ 2/9 Scores  PHQ - 2 Score 0 0    {VISON DENTAL STD PSA (Optional):27386} {History (Optional):23778}  Patient Care Team: Alvan Dorothyann BIRCH, MD as PCP - General   ROS    Objective:    BP 128/76   Pulse 76   Ht 5' 10 (1.778 m)   Wt 161 lb 1.3 oz (73.1 kg)   SpO2 100%   BMI 23.11 kg/m  {Vitals History (Optional):23777}  Physical Exam  {PhysExam [facility] (Optional):210964309}  No results found for any visits on 09/09/24. {Show previous labs (optional):23779}     Assessment & Plan:    Routine Health Maintenance and Physical Exam Immunization History  Administered Date(s) Administered   Moderna Sars-Covid-2 Vaccination 12/02/2019, 12/29/2019    Health Maintenance  Topic Date Due   DTaP/Tdap/Td (1 - Tdap) Never done   Hepatitis B Vaccines 19-59 Average Risk (1 of 3 - 19+ 3-dose series) Never done   Zoster Vaccines- Shingrix (1 of 2) Never done   Pneumococcal Vaccine: 50+ Years (1  of 1 - PCV) Never done   COVID-19 Vaccine (3 - Moderna risk series) 10/30/2024 (Originally 01/26/2020)   Influenza Vaccine  01/28/2025 (Originally 05/31/2024)   Fecal DNA (Cologuard)  07/20/2025   Hepatitis C Screening  Completed   HIV Screening  Completed   HPV VACCINES  Aged Out   Meningococcal B Vaccine  Aged Out    Discussed health benefits of physical activity, and encouraged him to engage in regular exercise appropriate for his age and condition.  Problem List Items Addressed This Visit       Musculoskeletal and Integument   DYSHIDROTIC ECZEMA   Relevant Medications   clobetasol  (OLUX ) 0.05 % topical foam   Other Visit Diagnoses       Wellness examination    -  Primary   Relevant Orders   CMP14+EGFR   Lipid Panel With LDL/HDL Ratio   CBC   PSA       Assessment and Plan Assessment & Plan     Return in about 6 months (around 03/09/2025) for BP.    Dorothyann Alvan, MD [facility] at [facility]

## 2024-09-10 ENCOUNTER — Ambulatory Visit: Payer: Self-pay | Admitting: Family Medicine

## 2024-09-10 ENCOUNTER — Encounter: Payer: Self-pay | Admitting: Family Medicine

## 2024-09-10 LAB — CMP14+EGFR
ALT: 29 IU/L (ref 0–44)
AST: 19 IU/L (ref 0–40)
Albumin: 4.7 g/dL (ref 3.8–4.9)
Alkaline Phosphatase: 74 IU/L (ref 47–123)
BUN/Creatinine Ratio: 10 (ref 9–20)
BUN: 12 mg/dL (ref 6–24)
Bilirubin Total: 1.3 mg/dL — ABNORMAL HIGH (ref 0.0–1.2)
CO2: 24 mmol/L (ref 20–29)
Calcium: 10.5 mg/dL — ABNORMAL HIGH (ref 8.7–10.2)
Chloride: 101 mmol/L (ref 96–106)
Creatinine, Ser: 1.24 mg/dL (ref 0.76–1.27)
Globulin, Total: 2.8 g/dL (ref 1.5–4.5)
Glucose: 98 mg/dL (ref 70–99)
Potassium: 4.8 mmol/L (ref 3.5–5.2)
Sodium: 137 mmol/L (ref 134–144)
Total Protein: 7.5 g/dL (ref 6.0–8.5)
eGFR: 67 mL/min/1.73 (ref >59)

## 2024-09-10 LAB — LIPID PANEL WITH LDL/HDL RATIO
Cholesterol, Total: 181 mg/dL (ref 100–199)
HDL: 51 mg/dL (ref >39)
LDL Chol Calc (NIH): 114 mg/dL — ABNORMAL HIGH (ref 0–99)
LDL/HDL Ratio: 2.2 ratio (ref 0.0–3.6)
Triglycerides: 86 mg/dL (ref 0–149)
VLDL Cholesterol Cal: 16 mg/dL (ref 5–40)

## 2024-09-10 LAB — CBC
Hematocrit: 44.2 % (ref 37.5–51.0)
Hemoglobin: 14.5 g/dL (ref 13.0–17.7)
MCH: 30.5 pg (ref 26.6–33.0)
MCHC: 32.8 g/dL (ref 31.5–35.7)
MCV: 93 fL (ref 79–97)
Platelets: 269 x10E3/uL (ref 150–450)
RBC: 4.76 x10E6/uL (ref 4.14–5.80)
RDW: 11.4 % — ABNORMAL LOW (ref 11.6–15.4)
WBC: 3.7 x10E3/uL (ref 3.4–10.8)

## 2024-09-10 LAB — PSA: Prostate Specific Ag, Serum: 1.7 ng/mL (ref 0.0–4.0)

## 2024-09-10 MED ORDER — ALPRAZOLAM 0.5 MG PO TABS: 0.5000 mg | ORAL_TABLET | Freq: Every day | ORAL | 0 refills | Status: AC | PRN

## 2024-09-10 NOTE — Assessment & Plan Note (Signed)
 Needs refill on foam.

## 2024-09-10 NOTE — Progress Notes (Signed)
 Overall labs look OK, just encourage you to work on lowering your cholesterol. Keep up healthy diet and regular exercise.

## 2024-11-05 DIAGNOSIS — I10 Essential (primary) hypertension: Secondary | ICD-10-CM

## 2024-11-05 DIAGNOSIS — I471 Supraventricular tachycardia, unspecified: Secondary | ICD-10-CM

## 2025-03-10 ENCOUNTER — Ambulatory Visit: Admitting: Family Medicine
